# Patient Record
Sex: Female | Born: 1970
Health system: Southern US, Community
[De-identification: ages and names within clinical notes are randomized; demographics above are authoritative.]

## PROBLEM LIST (undated history)

## (undated) DIAGNOSIS — O24419 Gestational diabetes mellitus in pregnancy, unspecified control: Secondary | ICD-10-CM

## (undated) DIAGNOSIS — E282 Polycystic ovarian syndrome: Secondary | ICD-10-CM

## (undated) DIAGNOSIS — R7303 Prediabetes: Secondary | ICD-10-CM

## (undated) DIAGNOSIS — D689 Coagulation defect, unspecified: Secondary | ICD-10-CM

## (undated) DIAGNOSIS — N979 Female infertility, unspecified: Secondary | ICD-10-CM

## (undated) DIAGNOSIS — D6851 Activated protein C resistance: Secondary | ICD-10-CM

## (undated) DIAGNOSIS — Z1589 Genetic susceptibility to other disease: Secondary | ICD-10-CM

## (undated) HISTORY — DX: Female infertility, unspecified: N97.9

## (undated) HISTORY — DX: Polycystic ovarian syndrome: E28.2

## (undated) HISTORY — DX: Activated protein C resistance: D68.51

## (undated) HISTORY — PX: TONSILLECTOMY: SUR1361

## (undated) HISTORY — DX: Prediabetes: R73.03

## (undated) HISTORY — DX: Coagulation defect, unspecified: D68.9

## (undated) HISTORY — PX: OTHER SURGICAL HISTORY: SHX169

## (undated) HISTORY — DX: Gestational diabetes mellitus in pregnancy, unspecified control: O24.419

## (undated) HISTORY — DX: Genetic susceptibility to other disease: Z15.89

---

## 1996-12-10 HISTORY — PX: COLPOSCOPY: SHX161

## 2004-12-10 DIAGNOSIS — O24419 Gestational diabetes mellitus in pregnancy, unspecified control: Secondary | ICD-10-CM

## 2007-12-11 DIAGNOSIS — O24419 Gestational diabetes mellitus in pregnancy, unspecified control: Secondary | ICD-10-CM

## 2016-01-19 DIAGNOSIS — L910 Hypertrophic scar: Secondary | ICD-10-CM | POA: Diagnosis not present

## 2016-01-19 DIAGNOSIS — D2272 Melanocytic nevi of left lower limb, including hip: Secondary | ICD-10-CM | POA: Diagnosis not present

## 2016-01-19 DIAGNOSIS — D2261 Melanocytic nevi of right upper limb, including shoulder: Secondary | ICD-10-CM | POA: Diagnosis not present

## 2016-01-19 DIAGNOSIS — D2262 Melanocytic nevi of left upper limb, including shoulder: Secondary | ICD-10-CM | POA: Diagnosis not present

## 2016-01-19 DIAGNOSIS — D2271 Melanocytic nevi of right lower limb, including hip: Secondary | ICD-10-CM | POA: Diagnosis not present

## 2016-01-19 DIAGNOSIS — D225 Melanocytic nevi of trunk: Secondary | ICD-10-CM | POA: Diagnosis not present

## 2016-01-19 DIAGNOSIS — D1801 Hemangioma of skin and subcutaneous tissue: Secondary | ICD-10-CM | POA: Diagnosis not present

## 2016-01-31 DIAGNOSIS — Z1389 Encounter for screening for other disorder: Secondary | ICD-10-CM | POA: Diagnosis not present

## 2016-01-31 DIAGNOSIS — Z6824 Body mass index (BMI) 24.0-24.9, adult: Secondary | ICD-10-CM | POA: Diagnosis not present

## 2016-01-31 DIAGNOSIS — K219 Gastro-esophageal reflux disease without esophagitis: Secondary | ICD-10-CM | POA: Diagnosis not present

## 2016-01-31 DIAGNOSIS — R7302 Impaired glucose tolerance (oral): Secondary | ICD-10-CM | POA: Diagnosis not present

## 2016-01-31 DIAGNOSIS — E048 Other specified nontoxic goiter: Secondary | ICD-10-CM | POA: Diagnosis not present

## 2016-01-31 DIAGNOSIS — J45909 Unspecified asthma, uncomplicated: Secondary | ICD-10-CM | POA: Diagnosis not present

## 2016-01-31 DIAGNOSIS — D682 Hereditary deficiency of other clotting factors: Secondary | ICD-10-CM | POA: Diagnosis not present

## 2016-05-10 MED FILL — PREVIDENT 5000 SENSITIVE PA: 1.1-5 | 30 days supply | Qty: 100 | Fill #0

## 2016-09-14 DIAGNOSIS — Z23 Encounter for immunization: Secondary | ICD-10-CM | POA: Diagnosis not present

## 2016-10-09 ENCOUNTER — Ambulatory Visit (INDEPENDENT_AMBULATORY_CARE_PROVIDER_SITE_OTHER): Payer: 59 | Admitting: Obstetrics & Gynecology

## 2016-10-09 ENCOUNTER — Encounter: Payer: Self-pay | Admitting: Obstetrics & Gynecology

## 2016-10-09 VITALS — BP 96/64 | HR 82 | Resp 14 | Ht 64.0 in | Wt 139.4 lb

## 2016-10-09 DIAGNOSIS — E282 Polycystic ovarian syndrome: Secondary | ICD-10-CM | POA: Diagnosis not present

## 2016-10-09 DIAGNOSIS — Z124 Encounter for screening for malignant neoplasm of cervix: Secondary | ICD-10-CM

## 2016-10-09 DIAGNOSIS — D6851 Activated protein C resistance: Secondary | ICD-10-CM | POA: Diagnosis not present

## 2016-10-09 DIAGNOSIS — E7212 Methylenetetrahydrofolate reductase deficiency: Secondary | ICD-10-CM | POA: Insufficient documentation

## 2016-10-09 DIAGNOSIS — Z1589 Genetic susceptibility to other disease: Secondary | ICD-10-CM

## 2016-10-09 DIAGNOSIS — Z01419 Encounter for gynecological examination (general) (routine) without abnormal findings: Secondary | ICD-10-CM

## 2016-10-09 DIAGNOSIS — Z1151 Encounter for screening for human papillomavirus (HPV): Secondary | ICD-10-CM | POA: Diagnosis not present

## 2016-10-09 NOTE — Progress Notes (Signed)
45 y.o. [id number] Married Caucasian F here for annual exam.  She is a new patient.  Referred from Dr. Forde Dandy.    Husband is a pediatric intensivist at [employer].  Pt is internal medicine physician by training.  Not working right now.    H/O PCOS.  Was on clomid.  Did IVF.  Had ovarian hyperstimulation with her first cycle.  Had work-up for recurrent miscarriages.  Is a carrier for MTHFR and Factor 5 Leiden heterozygous.  Was gestational diabetic with first pregnancy and then insulin requiring with her second pregnancy.    Cycles are very irregular.  Bleeding lasts typically five days.  First day is heaviest.  Longest amount of time between cycles is six weeks but then can have a cycle earlier.    PCP:  Dr. Forde Dandy.  Having yearly HbA1C on pt.    Patient's last menstrual period was 08/29/2016.          Sexually active: Yes.    The current method of family planning is vasectomy.    Exercising: Yes.    walking, elliptical  Smoker:  no  Health Maintenance: Pap:  2014 in [place] History of abnormal Pap:  Yes.  Remote hx.  MMG: never Colonoscopy:  never BMD:   never TDaP:  09/19/08  Pneumonia vaccine(s):  never Zostavax:   never Hep C testing: not indicated Screening Labs: PCP, Hb today: PCP, Urine today: declined   reports that she has never smoked. She has never used smokeless tobacco. She reports that she drinks alcohol. She reports that she does not use drugs.  Past Medical History:  Diagnosis Date  . Clotting disorder ([place])    carrier for MHTFR & Factor 5 Liden   . Gestational diabetes    2006 & 2009   . Infertility, female   . PCOS (polycystic ovarian syndrome)     Past Surgical History:  Procedure Laterality Date  . COLPOSCOPY  1998  . IVF    . maxillofacial surgery      teenager due to TMJ  . TONSILLECTOMY      Current Outpatient Prescriptions  Medication Sig Dispense Refill  . cetirizine (ZYRTEC) 10 MG tablet Take 10 mg by mouth as needed for allergies.    Marland Kitchen ibuprofen  (ADVIL,MOTRIN) 200 MG tablet Take 600 mg by mouth as needed.     No current facility-administered medications for this visit.     Family History  Problem Relation Age of Onset  . Osteoporosis Mother   . Hyperlipidemia Mother   . Parkinson's disease Mother   . Diabetes Father   . Hyperlipidemia Father   . GER disease Father   . Cancer Father     CLL (Chronic Lymphocytic Leukemia)  . Osteoporosis Maternal Grandmother     ROS:  Pertinent items are noted in HPI.  Otherwise, a comprehensive ROS was negative.  Exam:   BP 96/64 (BP Location: Right Arm, Patient Position: Sitting, Cuff Size: Normal)   Pulse 82   Resp 14   Ht 5' 4"  (1.626 m)   Wt 139 lb 6.4 oz (63.2 kg)   LMP 08/29/2016   BMI 23.93 kg/m    Height: 5' 4"  (162.6 cm)  Ht Readings from Last 3 Encounters:  10/09/16 5' 4"  (1.626 m)    General appearance: alert, cooperative and appears stated age Head: Normocephalic, without obvious abnormality, atraumatic Neck: no adenopathy, supple, symmetrical, trachea midline and thyroid normal to inspection and palpation Lungs: clear to auscultation bilaterally Breasts: normal appearance, no  masses or tenderness Heart: regular rate and rhythm Abdomen: soft, non-tender; bowel sounds normal; no masses,  no organomegaly Extremities: extremities normal, atraumatic, no cyanosis or edema Skin: Skin color, texture, turgor normal. No rashes or lesions Lymph nodes: Cervical, supraclavicular, and axillary nodes normal. No abnormal inguinal nodes palpated Neurologic: Grossly normal   Pelvic: External genitalia:  no lesions              Urethra:  normal appearing urethra with no masses, tenderness or lesions              Bartholins and Skenes: normal                 Vagina: normal appearing vagina with normal color and discharge, no lesions              Cervix: no lesions              Pap taken: Yes.   Bimanual Exam:  Uterus:  normal size, contour, position, consistency, mobility,  non-tender              Adnexa: normal adnexa and no mass, fullness, tenderness               Rectovaginal: Confirms               Anus:  normal sphincter tone, no lesions  Chaperone was present for exam.  A:  Well Woman with normal exam H/O PCOS H/O infertility MTHRF and Factor V Leiden heterozygous hx Irregular menstrual cycles  P:   Mammogram guidelines reviewed.  Information given.  Pt will scheduled. pap smear and HR HPV obtained today Lab work and vaccine with Dr. Carolanne Grumbling IUD information provided.  Would not recommend any estrogen containing contraceptive methods Return annually or prn

## 2016-10-10 DIAGNOSIS — Z1231 Encounter for screening mammogram for malignant neoplasm of breast: Secondary | ICD-10-CM | POA: Diagnosis not present

## 2016-10-11 LAB — IPS PAP TEST WITH HPV

## 2016-10-19 ENCOUNTER — Telehealth: Payer: Self-pay | Admitting: Obstetrics & Gynecology

## 2016-10-19 NOTE — Telephone Encounter (Signed)
Patient would like to get her PAP results

## 2016-10-19 NOTE — Telephone Encounter (Signed)
Spoke with patient. Advised of results as seen below patient verbalizes understanding.  Notes Recorded by Matthew Folks, RN on 10/15/2016 at 10:41 AM EST Annual exam scheduled for 10/14/17. ------  Notes Recorded by Megan Salon, MD on 10/12/2016 at 6:07 PM EDT 02 recall.  Routing to provider for final review. Patient agreeable to disposition. Will close encounter.

## 2016-10-23 ENCOUNTER — Encounter: Payer: Self-pay | Admitting: Obstetrics & Gynecology

## 2016-11-23 MED FILL — PREVIDENT 5000 SENSITIVE PA: 1.1-5 | 30 days supply | Qty: 100 | Fill #1

## 2017-01-30 DIAGNOSIS — Z6822 Body mass index (BMI) 22.0-22.9, adult: Secondary | ICD-10-CM | POA: Diagnosis not present

## 2017-01-30 DIAGNOSIS — J45909 Unspecified asthma, uncomplicated: Secondary | ICD-10-CM | POA: Diagnosis not present

## 2017-01-30 DIAGNOSIS — E048 Other specified nontoxic goiter: Secondary | ICD-10-CM | POA: Diagnosis not present

## 2017-01-30 DIAGNOSIS — K219 Gastro-esophageal reflux disease without esophagitis: Secondary | ICD-10-CM | POA: Diagnosis not present

## 2017-01-30 DIAGNOSIS — R7302 Impaired glucose tolerance (oral): Secondary | ICD-10-CM | POA: Diagnosis not present

## 2017-02-01 DIAGNOSIS — D225 Melanocytic nevi of trunk: Secondary | ICD-10-CM | POA: Diagnosis not present

## 2017-02-01 DIAGNOSIS — D2271 Melanocytic nevi of right lower limb, including hip: Secondary | ICD-10-CM | POA: Diagnosis not present

## 2017-02-01 DIAGNOSIS — D2261 Melanocytic nevi of right upper limb, including shoulder: Secondary | ICD-10-CM | POA: Diagnosis not present

## 2017-02-01 DIAGNOSIS — I788 Other diseases of capillaries: Secondary | ICD-10-CM | POA: Diagnosis not present

## 2017-02-01 DIAGNOSIS — D1801 Hemangioma of skin and subcutaneous tissue: Secondary | ICD-10-CM | POA: Diagnosis not present

## 2017-02-01 DIAGNOSIS — D2262 Melanocytic nevi of left upper limb, including shoulder: Secondary | ICD-10-CM | POA: Diagnosis not present

## 2017-02-01 DIAGNOSIS — D2272 Melanocytic nevi of left lower limb, including hip: Secondary | ICD-10-CM | POA: Diagnosis not present

## 2017-03-08 DIAGNOSIS — Z3183 Encounter for assisted reproductive fertility procedure cycle: Secondary | ICD-10-CM | POA: Diagnosis not present

## 2017-03-08 DIAGNOSIS — H18603 Keratoconus, unspecified, bilateral: Secondary | ICD-10-CM | POA: Diagnosis not present

## 2017-03-08 DIAGNOSIS — H52213 Irregular astigmatism, bilateral: Secondary | ICD-10-CM | POA: Diagnosis not present

## 2017-03-08 DIAGNOSIS — H18713 Corneal ectasia, bilateral: Secondary | ICD-10-CM | POA: Diagnosis not present

## 2017-04-03 DIAGNOSIS — H18713 Corneal ectasia, bilateral: Secondary | ICD-10-CM | POA: Diagnosis not present

## 2017-05-01 DIAGNOSIS — D1801 Hemangioma of skin and subcutaneous tissue: Secondary | ICD-10-CM | POA: Diagnosis not present

## 2017-09-11 MED FILL — PREVIDENT 5000 SENSITIVE PA: 1.1-5 | 30 days supply | Qty: 100 | Fill #0

## 2017-10-14 ENCOUNTER — Ambulatory Visit (INDEPENDENT_AMBULATORY_CARE_PROVIDER_SITE_OTHER): Payer: 59 | Admitting: Obstetrics & Gynecology

## 2017-10-14 ENCOUNTER — Encounter: Payer: Self-pay | Admitting: Obstetrics & Gynecology

## 2017-10-14 VITALS — BP 112/64 | HR 80 | Resp 16 | Ht 63.25 in | Wt 141.8 lb

## 2017-10-14 DIAGNOSIS — Z01419 Encounter for gynecological examination (general) (routine) without abnormal findings: Secondary | ICD-10-CM | POA: Diagnosis not present

## 2017-10-14 NOTE — Progress Notes (Signed)
46 y.o. [id number] MarriedCaucasianF here for annual exam.  Doing well.  Cycles are a little more regular this year.  Flow seems a little lighter this year.  Has occasional months where she has two or three days of low back pain with some sciatic pain.  Motrin and heat help.  Is gone after two or three days.  PCP:  Dr. Forde Dandy  Patient's last menstrual period was 09/17/2017.          Sexually active: Yes.    The current method of family planning is vasectomy.    Exercising: No.  The patient does not participate in regular exercise at present. Smoker:  no  Health Maintenance: Pap:  10/09/16 Pap and HR HPV negative History of abnormal Pap:  Yes, years ago MMG:  10/10/16 BIRADS 1 negative/density b Colonoscopy:  never BMD:   never TDaP:  09/19/08 Pneumonia vaccine(s):  never Zostavax:   never Hep C testing: not indicated Screening Labs: PCP   reports that  has never smoked. she has never used smokeless tobacco. She reports that she drinks alcohol. She reports that she does not use drugs.  Past Medical History:  Diagnosis Date  . Clotting disorder ([place])    carrier for MTHFR & Factor 5 Liden   . Gestational diabetes    2006 & 2009   . Infertility, female   . PCOS (polycystic ovarian syndrome)     Past Surgical History:  Procedure Laterality Date  . COLPOSCOPY  1998  . IVF    . maxillofacial surgery      teenager due to TMJ  . TONSILLECTOMY      Current Outpatient Medications  Medication Sig Dispense Refill  . cetirizine (ZYRTEC) 10 MG tablet Take 10 mg by mouth as needed for allergies.    Marland Kitchen ibuprofen (ADVIL,MOTRIN) 200 MG tablet Take 600 mg by mouth as needed.     No current facility-administered medications for this visit.     Family History  Problem Relation Age of Onset  . Osteoporosis Mother   . Hyperlipidemia Mother   . Parkinson's disease Mother   . Diabetes Father   . Hyperlipidemia Father   . GER disease Father   . Cancer Father        CLL (Chronic Lymphocytic  Leukemia)  . Osteoporosis Maternal Grandmother     ROS:  Pertinent items are noted in HPI.  Otherwise, a comprehensive ROS was negative.  Exam:   BP 112/64 (BP Location: Right Arm, Patient Position: Sitting, Cuff Size: Normal)   Pulse 80   Resp 16   Ht 5' 3.25" (1.607 m)   Wt 141 lb 12.8 oz (64.3 kg)   LMP 09/17/2017   BMI 24.92 kg/m   Height: 5' 3.25" (160.7 cm)  Ht Readings from Last 3 Encounters:  10/14/17 5' 3.25" (1.607 m)  10/09/16 5' 4"  (1.626 m)    General appearance: alert, cooperative and appears stated age Head: Normocephalic, without obvious abnormality, atraumatic Neck: no adenopathy, supple, symmetrical, trachea midline and thyroid normal to inspection and palpation Lungs: clear to auscultation bilaterally Breasts: normal appearance, no masses or tenderness Heart: regular rate and rhythm Abdomen: soft, non-tender; bowel sounds normal; no masses,  no organomegaly Extremities: extremities normal, atraumatic, no cyanosis or edema Skin: Skin color, texture, turgor normal. No rashes or lesions Lymph nodes: Cervical, supraclavicular, and axillary nodes normal. No abnormal inguinal nodes palpated Neurologic: Grossly normal   Pelvic: External genitalia:  no lesions  Urethra:  normal appearing urethra with no masses, tenderness or lesions              Bartholins and Skenes: normal                 Vagina: normal appearing vagina with normal color and discharge, no lesions              Cervix: no lesions              Pap taken: No. Bimanual Exam:  Uterus:  normal size, contour, position, consistency, mobility, non-tender              Adnexa: normal adnexa and no mass, fullness, tenderness               Rectovaginal: Confirms               Anus:  normal sphincter tone, no lesions  Chaperone was present for exam.  A:  Well Woman with normal exam H/O PCOS H/O infertility MTHFR and Factor V Leiden heterozygous hx Irregular cycles that have improved H/O  gestational diabetes Low back pain, intermittent, that seems mid cycle to her  P:   Mammogram guidelines reviewed.  Will repeat next year pap smear with neg HR HPV obtained 2017. ACS colonoscopy guidelines reviewed Consider PUS with onset of pt.  Pt knows to call to be scheduled when having pain if possible Will update Tdap next year Lab work and vaccines UTD Return annually or prn

## 2018-01-29 MED FILL — PREVIDENT 5000 SENSITIVE PA: 1.1-5 | 30 days supply | Qty: 100 | Fill #1

## 2018-03-20 DIAGNOSIS — D2262 Melanocytic nevi of left upper limb, including shoulder: Secondary | ICD-10-CM | POA: Diagnosis not present

## 2018-03-20 DIAGNOSIS — Q828 Other specified congenital malformations of skin: Secondary | ICD-10-CM | POA: Diagnosis not present

## 2018-03-20 DIAGNOSIS — D2271 Melanocytic nevi of right lower limb, including hip: Secondary | ICD-10-CM | POA: Diagnosis not present

## 2018-03-20 DIAGNOSIS — D2272 Melanocytic nevi of left lower limb, including hip: Secondary | ICD-10-CM | POA: Diagnosis not present

## 2018-03-20 DIAGNOSIS — D225 Melanocytic nevi of trunk: Secondary | ICD-10-CM | POA: Diagnosis not present

## 2018-03-20 DIAGNOSIS — L814 Other melanin hyperpigmentation: Secondary | ICD-10-CM | POA: Diagnosis not present

## 2018-03-20 DIAGNOSIS — L218 Other seborrheic dermatitis: Secondary | ICD-10-CM | POA: Diagnosis not present

## 2018-03-20 DIAGNOSIS — L821 Other seborrheic keratosis: Secondary | ICD-10-CM | POA: Diagnosis not present

## 2018-03-20 DIAGNOSIS — D2261 Melanocytic nevi of right upper limb, including shoulder: Secondary | ICD-10-CM | POA: Diagnosis not present

## 2018-06-06 DIAGNOSIS — J45909 Unspecified asthma, uncomplicated: Secondary | ICD-10-CM | POA: Diagnosis not present

## 2018-06-06 DIAGNOSIS — K219 Gastro-esophageal reflux disease without esophagitis: Secondary | ICD-10-CM | POA: Diagnosis not present

## 2018-06-06 DIAGNOSIS — R7302 Impaired glucose tolerance (oral): Secondary | ICD-10-CM | POA: Diagnosis not present

## 2018-06-06 DIAGNOSIS — E048 Other specified nontoxic goiter: Secondary | ICD-10-CM | POA: Diagnosis not present

## 2018-06-06 DIAGNOSIS — Z1389 Encounter for screening for other disorder: Secondary | ICD-10-CM | POA: Diagnosis not present

## 2018-06-06 DIAGNOSIS — Z6824 Body mass index (BMI) 24.0-24.9, adult: Secondary | ICD-10-CM | POA: Diagnosis not present

## 2018-07-30 MED FILL — PREVIDENT 5000 SENSITIVE PA: 1.1-5 | 30 days supply | Qty: 100 | Fill #2

## 2018-10-08 DIAGNOSIS — Z23 Encounter for immunization: Secondary | ICD-10-CM | POA: Diagnosis not present

## 2018-12-12 DIAGNOSIS — S93524A Sprain of metatarsophalangeal joint of right lesser toe(s), initial encounter: Secondary | ICD-10-CM | POA: Diagnosis not present

## 2018-12-30 ENCOUNTER — Encounter: Payer: Self-pay | Admitting: Obstetrics & Gynecology

## 2018-12-30 ENCOUNTER — Ambulatory Visit (INDEPENDENT_AMBULATORY_CARE_PROVIDER_SITE_OTHER): Payer: 59 | Admitting: Obstetrics & Gynecology

## 2018-12-30 VITALS — BP 120/86 | HR 100 | Resp 16 | Ht 63.5 in | Wt 136.0 lb

## 2018-12-30 DIAGNOSIS — Z01419 Encounter for gynecological examination (general) (routine) without abnormal findings: Secondary | ICD-10-CM

## 2018-12-30 DIAGNOSIS — Z23 Encounter for immunization: Secondary | ICD-10-CM

## 2018-12-30 DIAGNOSIS — Z1211 Encounter for screening for malignant neoplasm of colon: Secondary | ICD-10-CM | POA: Diagnosis not present

## 2018-12-30 NOTE — Progress Notes (Signed)
48 y.o. [id number] Married White or Caucasian female here for annual exam.  Doing well.  Denies vaginal bleeding.  Father was hospitalized over the holidays and this was stressful for her.  He has CLL and had pneumonia over the holiday.  He has issues with aspiration.    Cycles are "fairly" regular.  Flow has gotten much shorter with only one that is heavy.   PCP:  Dr. Forde Dandy.  Has appt in June.       Patient's last menstrual period was 12/11/2018 (exact date).          Sexually active: Yes.    The current method of family planning is vasectomy.    Exercising: No.   Smoker:  no  Health Maintenance: Pap:  10/09/16 Neg. HR HPV:neg  History of abnormal Pap:  Yes, remote hx  MMG:  10/10/16 BIRADS1:Neg  Colonoscopy:  Never BMD:   Never TDaP:  09/19/2008 Screening Labs: PCP   reports that she has never smoked. She has never used smokeless tobacco. She reports previous alcohol use. She reports that she does not use drugs.  Past Medical History:  Diagnosis Date  . Clotting disorder ([place])    carrier for MTHFR & Factor 5 Liden   . Gestational diabetes    2006 & 2009   . Infertility, female   . PCOS (polycystic ovarian syndrome)     Past Surgical History:  Procedure Laterality Date  . COLPOSCOPY  1998  . IVF    . maxillofacial surgery      teenager due to TMJ  . TONSILLECTOMY      Current Outpatient Medications  Medication Sig Dispense Refill  . cetirizine (ZYRTEC) 10 MG tablet Take 10 mg by mouth as needed for allergies.    Marland Kitchen ibuprofen (ADVIL,MOTRIN) 200 MG tablet Take 600 mg by mouth as needed.     No current facility-administered medications for this visit.     Family History  Problem Relation Age of Onset  . Osteoporosis Mother   . Hyperlipidemia Mother   . Parkinson's disease Mother   . Diabetes Father   . Hyperlipidemia Father   . GER disease Father   . Cancer Father        CLL (Chronic Lymphocytic Leukemia)  . Osteoporosis Maternal Grandmother     Review of  Systems  Genitourinary:       Menstrual cycle changes   Musculoskeletal: Positive for myalgias.  All other systems reviewed and are negative.   Exam:   BP 120/86 (BP Location: Right Arm, Patient Position: Sitting, Cuff Size: Normal)   Pulse 100   Resp 16   Ht 5' 3.5" (1.613 m)   Wt 136 lb (61.7 kg)   LMP 12/11/2018 (Exact Date)   BMI 23.71 kg/m      Height: 5' 3.5" (161.3 cm)  Ht Readings from Last 3 Encounters:  12/30/18 5' 3.5" (1.613 m)  10/14/17 5' 3.25" (1.607 m)  10/09/16 5' 4"  (1.626 m)    General appearance: alert, cooperative and appears stated age Head: Normocephalic, without obvious abnormality, atraumatic Neck: no adenopathy, supple, symmetrical, trachea midline and thyroid normal to inspection and palpation Lungs: clear to auscultation bilaterally Breasts: normal appearance, no masses or tenderness Heart: regular rate and rhythm Abdomen: soft, non-tender; bowel sounds normal; no masses,  no organomegaly Extremities: extremities normal, atraumatic, no cyanosis or edema Skin: Skin color, texture, turgor normal. No rashes or lesions Lymph nodes: Cervical, supraclavicular, and axillary nodes normal. No abnormal inguinal nodes palpated Neurologic:  Grossly normal   Pelvic: External genitalia:  no lesions              Urethra:  normal appearing urethra with no masses, tenderness or lesions              Bartholins and Skenes: normal                 Vagina: normal appearing vagina with normal color and discharge, no lesions              Cervix: no lesions              Pap taken: No. Bimanual Exam:  Uterus:  normal size, contour, position, consistency, mobility, non-tender              Adnexa: normal adnexa and no mass, fullness, tenderness               Rectovaginal: Confirms               Anus:  normal sphincter tone, no lesions  Chaperone was present for exam.  A:  Well Woman with normal exam H/O PCOS Infertility hx H/O +MRHFT and Factor V Leiden heterozygous  testing H/O gestational diabetes  P:   Mammogram guidelines reviewed.  Doing yearly pap smear with neg HR HPV 10/17 Tdap updated today New ACS colon cancer guidelines reviewed.  IFOB given.  Lab work done with Dr. Forde Dandy this summer. return annually or prn

## 2018-12-30 NOTE — Addendum Note (Signed)
Addended by: Polly Cobia on: 12/30/2018 03:45 PM   Modules accepted: Orders

## 2018-12-31 ENCOUNTER — Telehealth: Payer: Self-pay | Admitting: Obstetrics & Gynecology

## 2018-12-31 DIAGNOSIS — Z1211 Encounter for screening for malignant neoplasm of colon: Secondary | ICD-10-CM

## 2018-12-31 NOTE — Telephone Encounter (Signed)
Order to Dr. Sabra Heck.  Responded to patient via mychart.  Encounter closed.

## 2018-12-31 NOTE — Telephone Encounter (Signed)
Patient sent the following correspondence through [place]. Routing to triage to assist patient with request.  I have checked with my insurance and Cologuard is covered 100% regardless of age. I would like you to order it for me.     Thank you,    Tina Jensen

## 2019-01-07 MED FILL — PREVIDENT 5000 BOOSTER PLUS: 1.1 | 30 days supply | Qty: 100 | Fill #0

## 2019-01-29 MED FILL — PREVIDENT 5000 SENSITIVE PA: 1.1-5 | 30 days supply | Qty: 100 | Fill #0

## 2019-03-02 ENCOUNTER — Encounter: Payer: Self-pay | Admitting: Obstetrics & Gynecology

## 2019-03-02 DIAGNOSIS — Z1212 Encounter for screening for malignant neoplasm of rectum: Secondary | ICD-10-CM | POA: Diagnosis not present

## 2019-03-02 DIAGNOSIS — Z1211 Encounter for screening for malignant neoplasm of colon: Secondary | ICD-10-CM | POA: Diagnosis not present

## 2019-03-05 LAB — COLOGUARD: Cologuard: NEGATIVE

## 2019-03-09 ENCOUNTER — Telehealth: Payer: Self-pay | Admitting: Obstetrics and Gynecology

## 2019-03-09 NOTE — Telephone Encounter (Signed)
Patient notified of results. Patient verbalizes understanding and is agreeable.   Encounter closed.

## 2019-03-09 NOTE — Telephone Encounter (Signed)
This is Dr. Quincy Simmonds covering for Dr. Sabra Heck who is out of the office.   Please let patient know of her negative Cologuard test result.

## 2019-05-14 DIAGNOSIS — D2262 Melanocytic nevi of left upper limb, including shoulder: Secondary | ICD-10-CM | POA: Diagnosis not present

## 2019-05-14 DIAGNOSIS — L821 Other seborrheic keratosis: Secondary | ICD-10-CM | POA: Diagnosis not present

## 2019-05-14 DIAGNOSIS — D225 Melanocytic nevi of trunk: Secondary | ICD-10-CM | POA: Diagnosis not present

## 2019-05-14 DIAGNOSIS — D2261 Melanocytic nevi of right upper limb, including shoulder: Secondary | ICD-10-CM | POA: Diagnosis not present

## 2019-06-08 DIAGNOSIS — E785 Hyperlipidemia, unspecified: Secondary | ICD-10-CM | POA: Insufficient documentation

## 2019-06-08 DIAGNOSIS — K219 Gastro-esophageal reflux disease without esophagitis: Secondary | ICD-10-CM | POA: Diagnosis not present

## 2019-06-08 DIAGNOSIS — Z1331 Encounter for screening for depression: Secondary | ICD-10-CM | POA: Diagnosis not present

## 2019-06-08 DIAGNOSIS — R7302 Impaired glucose tolerance (oral): Secondary | ICD-10-CM | POA: Diagnosis not present

## 2019-06-08 DIAGNOSIS — J45909 Unspecified asthma, uncomplicated: Secondary | ICD-10-CM | POA: Diagnosis not present

## 2019-06-08 DIAGNOSIS — E049 Nontoxic goiter, unspecified: Secondary | ICD-10-CM | POA: Diagnosis not present

## 2019-07-01 DIAGNOSIS — H18623 Keratoconus, unstable, bilateral: Secondary | ICD-10-CM | POA: Diagnosis not present

## 2019-09-12 DIAGNOSIS — Z23 Encounter for immunization: Secondary | ICD-10-CM | POA: Diagnosis not present

## 2020-01-04 ENCOUNTER — Telehealth: Payer: Self-pay | Admitting: *Deleted

## 2020-01-04 ENCOUNTER — Other Ambulatory Visit: Payer: Self-pay

## 2020-01-04 ENCOUNTER — Ambulatory Visit (INDEPENDENT_AMBULATORY_CARE_PROVIDER_SITE_OTHER): Payer: BC Managed Care – PPO | Admitting: Obstetrics & Gynecology

## 2020-01-04 ENCOUNTER — Encounter: Payer: Self-pay | Admitting: Obstetrics & Gynecology

## 2020-01-04 VITALS — BP 110/60 | HR 88 | Temp 97.9°F | Resp 10 | Ht 63.25 in | Wt 148.0 lb

## 2020-01-04 DIAGNOSIS — Z01419 Encounter for gynecological examination (general) (routine) without abnormal findings: Secondary | ICD-10-CM

## 2020-01-04 DIAGNOSIS — E049 Nontoxic goiter, unspecified: Secondary | ICD-10-CM

## 2020-01-04 NOTE — Telephone Encounter (Signed)
-----   Message from Megan Salon, MD sent at 01/04/2020  2:58 PM EST ----- Regarding: thyroid ultrasound Sharee Pimple This pt needs a thyroid ultrasound scheduled.  Afternoons are best.  Thanks. Vinnie Level

## 2020-01-04 NOTE — Telephone Encounter (Signed)
Order placed for Thyroid US at [facility].   Spoke with Tye [place] at [facility]. Patient scheduled for thyroid US on 01/06/20 arriving at 3:40pm, appt at 4pm. [address] location.   Spoke with patient, advised of appt as seen above, patient is agreeable to date and time.   Placed in [place] hold.   Routing to provider for final review. Patient is agreeable to disposition. Will close encounter.   Cc: Lerry Liner, Magdalene Patricia

## 2020-01-04 NOTE — Progress Notes (Signed)
49 y.o. [id number] Married White or Caucasian female here for annual exam.  Doing well but father died in 28-Nov-2023.  He was in [place].  She was able to go home and be with him.    Husband took a new job with [employer] about a year ago.  Has been working from home.    Cycles are still regular.  Flow lasts about 3 days.  This continues to be lighter.    Patient's last menstrual period was 12/12/2019.          Sexually active: Yes.    The current method of family planning is vasectomy.    Exercising: No.  The patient does not participate in regular exercise at present. Smoker:  no  Health Maintenance: Pap:  10/09/16 Neg. HR HPV:neg  History of abnormal Pap:  Yes, remote hx (had colposcopies but never had treatment.  At least 20 years ago).   MMG: 10/10/16 BIRADS1:Neg.  Aware this is due.   Colonoscopy:  Cologuard in 2020 which was negative BMD:   n/a TDaP:  12/30/18 Pneumonia vaccine(s):  n/a Shingrix:   no Hep C testing: n/a Screening Labs: PCP   reports that she has never smoked. She has never used smokeless tobacco. She reports previous alcohol use. She reports that she does not use drugs.  Past Medical History:  Diagnosis Date  . Clotting disorder ([place])    carrier for MTHFR & Factor 5 Liden   . Gestational diabetes    2006 & 2009   . Infertility, female   . PCOS (polycystic ovarian syndrome)     Past Surgical History:  Procedure Laterality Date  . COLPOSCOPY  1998  . IVF    . maxillofacial surgery      teenager due to TMJ  . TONSILLECTOMY      Current Outpatient Medications  Medication Sig Dispense Refill  . cetirizine (ZYRTEC) 10 MG tablet Take 10 mg by mouth as needed for allergies.    Marland Kitchen ibuprofen (ADVIL,MOTRIN) 200 MG tablet Take 600 mg by mouth as needed.    . SF 5000 PLUS 1.1 % CREA dental cream      No current facility-administered medications for this visit.    Family History  Problem Relation Age of Onset  . Osteoporosis Mother   . Hyperlipidemia Mother   .  Parkinson's disease Mother   . Diabetes Father   . Hyperlipidemia Father   . GER disease Father   . Cancer Father        CLL (Chronic Lymphocytic Leukemia)  . Osteoporosis Maternal Grandmother     Review of Systems  All other systems reviewed and are negative.   Exam:   BP 110/60 (BP Location: Right Arm, Patient Position: Sitting, Cuff Size: Normal)   Pulse 88   Temp 97.9 F (36.6 C) (Temporal)   Resp 10   Ht 5' 3.25" (1.607 m)   Wt 148 lb (67.1 kg)   LMP 12/12/2019   BMI 26.01 kg/m      Height: 5' 3.25" (160.7 cm)  Ht Readings from Last 3 Encounters:  01/04/20 5' 3.25" (1.607 m)  12/30/18 5' 3.5" (1.613 m)  10/14/17 5' 3.25" (1.607 m)    General appearance: alert, cooperative and appears stated age Head: Normocephalic, without obvious abnormality, atraumatic Neck: no adenopathy, supple, symmetrical, trachea midline and thyroid enlarged R>L without discrete mass Lungs: clear to auscultation bilaterally Breasts: normal appearance, no masses or tenderness Heart: regular rate and rhythm Abdomen: soft, non-tender; bowel sounds normal;  no masses,  no organomegaly Extremities: extremities normal, atraumatic, no cyanosis or edema Skin: Skin color, texture, turgor normal. No rashes or lesions Lymph nodes: Cervical, supraclavicular, and axillary nodes normal. No abnormal inguinal nodes palpated Neurologic: Grossly normal   Pelvic: External genitalia:  no lesions              Urethra:  normal appearing urethra with no masses, tenderness or lesions              Bartholins and Skenes: normal                 Vagina: normal appearing vagina with normal color and discharge, no lesions              Cervix: no lesions              Pap taken: No. Bimanual Exam:  Uterus:  normal size, contour, position, consistency, mobility, non-tender              Adnexa: normal adnexa and no mass, fullness, tenderness               Rectovaginal: Confirms               Anus:  normal sphincter  tone, no lesions  Chaperone, Terence Lux, CMA, was present for exam.  A:  Well Woman with normal exam H/o PCOS H/o infertility H/o being +MTHFR and factor V Leiden heterozygous H/o gestational diabetes Enlarged thyroid without masses noted today  P:   Mammogram guidelines reviewed.  Pt is aware this is due.  Phone number to schedule given pap smear neg with neg HR HPV 2017.  2020 ASCCP guidelines reviewed.  Not obtained today. Thyroid ultrasound will be scheduled TSH obtained today Remaining lab work obtained by Dr. Forde Dandy Return annually or prn

## 2020-01-05 LAB — TSH: TSH: 0.585 u[IU]/mL (ref 0.450–4.500)

## 2020-01-06 ENCOUNTER — Ambulatory Visit
Admission: RE | Admit: 2020-01-06 | Discharge: 2020-01-06 | Disposition: A | Discharge status: Home / Self Care | Payer: BC Managed Care – PPO | Source: Ambulatory Visit | Attending: Obstetrics & Gynecology | Admitting: Obstetrics & Gynecology

## 2020-01-06 DIAGNOSIS — E041 Nontoxic single thyroid nodule: Secondary | ICD-10-CM | POA: Diagnosis not present

## 2020-01-06 DIAGNOSIS — E049 Nontoxic goiter, unspecified: Secondary | ICD-10-CM

## 2020-01-07 ENCOUNTER — Other Ambulatory Visit: Payer: Self-pay | Admitting: *Deleted

## 2020-01-07 DIAGNOSIS — E041 Nontoxic single thyroid nodule: Secondary | ICD-10-CM

## 2020-01-07 DIAGNOSIS — E049 Nontoxic goiter, unspecified: Secondary | ICD-10-CM

## 2020-05-11 ENCOUNTER — Other Ambulatory Visit: Payer: Self-pay | Admitting: Obstetrics & Gynecology

## 2020-05-11 DIAGNOSIS — Z1231 Encounter for screening mammogram for malignant neoplasm of breast: Secondary | ICD-10-CM

## 2020-05-17 ENCOUNTER — Other Ambulatory Visit: Payer: Self-pay

## 2020-05-17 ENCOUNTER — Ambulatory Visit
Admission: RE | Admit: 2020-05-17 | Discharge: 2020-05-17 | Disposition: A | Discharge status: Home / Self Care | Payer: BC Managed Care – PPO | Source: Ambulatory Visit

## 2020-05-17 DIAGNOSIS — Z1231 Encounter for screening mammogram for malignant neoplasm of breast: Secondary | ICD-10-CM

## 2020-06-06 DIAGNOSIS — R7302 Impaired glucose tolerance (oral): Secondary | ICD-10-CM | POA: Diagnosis not present

## 2020-06-06 DIAGNOSIS — E7849 Other hyperlipidemia: Secondary | ICD-10-CM | POA: Diagnosis not present

## 2020-06-06 DIAGNOSIS — E049 Nontoxic goiter, unspecified: Secondary | ICD-10-CM | POA: Diagnosis not present

## 2020-06-06 DIAGNOSIS — J309 Allergic rhinitis, unspecified: Secondary | ICD-10-CM | POA: Diagnosis not present

## 2020-07-20 DIAGNOSIS — D2271 Melanocytic nevi of right lower limb, including hip: Secondary | ICD-10-CM | POA: Diagnosis not present

## 2020-07-20 DIAGNOSIS — D2261 Melanocytic nevi of right upper limb, including shoulder: Secondary | ICD-10-CM | POA: Diagnosis not present

## 2020-07-20 DIAGNOSIS — D225 Melanocytic nevi of trunk: Secondary | ICD-10-CM | POA: Diagnosis not present

## 2020-07-20 DIAGNOSIS — D2272 Melanocytic nevi of left lower limb, including hip: Secondary | ICD-10-CM | POA: Diagnosis not present

## 2020-09-10 DIAGNOSIS — Z23 Encounter for immunization: Secondary | ICD-10-CM | POA: Diagnosis not present

## 2020-11-15 ENCOUNTER — Telehealth: Payer: Self-pay

## 2020-11-15 DIAGNOSIS — E282 Polycystic ovarian syndrome: Secondary | ICD-10-CM

## 2020-11-15 DIAGNOSIS — R102 Pelvic and perineal pain: Secondary | ICD-10-CM

## 2020-11-15 NOTE — Telephone Encounter (Signed)
Spoke with patient regarding benefits for scheduled Pelvic ultrasound. Patient acknowledges understanding of information presented.

## 2020-11-15 NOTE — Telephone Encounter (Signed)
AEX 12/2019 with SM H/o PCOS No contraception or HRT LMP 11/29  Spoke with pt. Pt states having the intermittent, constant pelvic pain, low back pain that is mostly on right side for 1 week. Pt states had this in the past and Dr Sabra Heck told her to call and have PUS when happening to see if help with exam. Pt denies fever, chills, NVD. Only took Motrin or Tylenol PRN when had cycle. Describes pain as dull, was intermittent, but has been constant now for 1 week.   Per reviewed notes at [facility] 10/2017 by Dr Sabra Heck:  Consider PUS with onset of pt.  Pt knows to call to be scheduled when having pain if possible.  Pt advised ok to schedule PUS. Pt aware Dr Sabra Heck not in office. Ok to see another provider. Pt scheduled with first provider on PUS schedule this week since having current pain on 12/9 at 1100 am with Dr Talbert Nan.  Routing to Dr Talbert Nan for review Cc: Hayley for precert  Orders placed   Ok to proceed as scheduled?

## 2020-11-15 NOTE — Telephone Encounter (Signed)
Patient is having right abdominal pain.

## 2020-11-17 ENCOUNTER — Ambulatory Visit (INDEPENDENT_AMBULATORY_CARE_PROVIDER_SITE_OTHER): Payer: BC Managed Care – PPO

## 2020-11-17 ENCOUNTER — Other Ambulatory Visit: Payer: Self-pay

## 2020-11-17 ENCOUNTER — Ambulatory Visit (INDEPENDENT_AMBULATORY_CARE_PROVIDER_SITE_OTHER): Payer: BC Managed Care – PPO | Admitting: Obstetrics and Gynecology

## 2020-11-17 ENCOUNTER — Encounter: Payer: Self-pay | Admitting: Obstetrics and Gynecology

## 2020-11-17 VITALS — BP 110/64 | HR 97 | Ht 64.0 in | Wt 150.0 lb

## 2020-11-17 DIAGNOSIS — N949 Unspecified condition associated with female genital organs and menstrual cycle: Secondary | ICD-10-CM

## 2020-11-17 DIAGNOSIS — R102 Pelvic and perineal pain: Secondary | ICD-10-CM

## 2020-11-17 DIAGNOSIS — E282 Polycystic ovarian syndrome: Secondary | ICD-10-CM | POA: Diagnosis not present

## 2020-11-17 DIAGNOSIS — R9389 Abnormal findings on diagnostic imaging of other specified body structures: Secondary | ICD-10-CM

## 2020-11-17 MED ORDER — DOXYCYCLINE HYCLATE 100 MG PO CAPS: 100.0000 mg | ORAL_CAPSULE | Freq: Two times a day (BID) | ORAL | 0 refills | Status: DC

## 2020-11-17 NOTE — Progress Notes (Signed)
GYNECOLOGY  VISIT   HPI: 49 y.o.   Married White or Caucasian Not Hispanic or Latino  female   [phone] with Patient's last menstrual period was 11/07/2020.   here for consult for pelvic pain.  She reports intermittent issues with a dull, aching pelvic pain. Feels full in her pelvis. She has noticed more in the middle of the cycle, wondered if it was ovulation. This pain has been intermittent for at least 3 years.  This episode of pain started just prior to her cycle and has persisted. The pain is worse at night when she isn't active, hard to get comfortable. Can radiate to her buttock. No bowel changes, BM qd. No urinary c/o.   She reports monthly cycles x 3-4 days. Heavy x 1 day where she saturates a pad in 6 hours. No intermenstrual cramps. Bad cramps for one day and a little cramping prior to her cycle.     GYNECOLOGIC HISTORY: Patient's last menstrual period was 11/07/2020.  Contraception: vasectomy  Menopausal hormone therapy: none         OB History    Gravida  6   Para      Term      Preterm      AB  4   Living  2     SAB  4   IAB      Ectopic      Multiple      Live Births                 Patient Active Problem List   Diagnosis Date Noted  . PCOS (polycystic ovarian syndrome) 10/09/2016  . Factor V Leiden carrier ([place]) 10/09/2016  . MTHFR mutation 10/09/2016    Past Medical History:  Diagnosis Date  . Clotting disorder ([place])    carrier for MTHFR & Factor 5 Liden   . Gestational diabetes    2006 & 2009   . Infertility, female   . PCOS (polycystic ovarian syndrome)     Past Surgical History:  Procedure Laterality Date  . COLPOSCOPY  1998  . IVF    . maxillofacial surgery      teenager due to TMJ  . TONSILLECTOMY      Current Outpatient Medications  Medication Sig Dispense Refill  . cetirizine (ZYRTEC) 10 MG tablet Take 10 mg by mouth as needed for allergies.    Marland Kitchen ibuprofen (ADVIL,MOTRIN) 200 MG tablet Take 600 mg by mouth as needed.     . rosuvastatin (CRESTOR) 5 MG tablet Take 5 mg by mouth daily.    . SF 5000 PLUS 1.1 % CREA dental cream      No current facility-administered medications for this visit.     ALLERGIES: Patient has no known allergies.  Family History  Problem Relation Age of Onset  . Osteoporosis Mother   . Hyperlipidemia Mother   . Parkinson's disease Mother   . Diabetes Father   . Hyperlipidemia Father   . GER disease Father   . Cancer Father        CLL (Chronic Lymphocytic Leukemia)  . Osteoporosis Maternal Grandmother     Social History   Socioeconomic History  . Marital status: Married    Spouse name: Not on file  . Number of children: Not on file  . Years of education: Not on file  . Highest education level: Not on file  Occupational History  . Not on file  Tobacco Use  . Smoking status: Never Smoker  .  Smokeless tobacco: Never Used  Vaping Use  . Vaping Use: Never used  Substance and Sexual Activity  . Alcohol use: Not Currently  . Drug use: No  . Sexual activity: Yes    Comment: husband vasectomy  Other Topics Concern  . Not on file  Social History Narrative  . Not on file   Social Determinants of Health   Financial Resource Strain: Not on file  Food Insecurity: Not on file  Transportation Needs: Not on file  Physical Activity: Not on file  Stress: Not on file  Social Connections: Not on file  Intimate Partner Violence: Not on file    Review of Systems  All other systems reviewed and are negative.   PHYSICAL EXAMINATION:    BP 110/64   Pulse 97   Ht 5' 4"  (1.626 m)   Wt 150 lb (68 kg)   LMP 11/07/2020   SpO2 98%   BMI 25.75 kg/m     General appearance: alert, cooperative and appears stated age Abdomen: soft, non-tender; non distended, no masses,  no organomegaly  Pelvic: External genitalia:  no lesions              Urethra:  normal appearing urethra with no masses, tenderness or lesions              Bartholins and Skenes: normal                  Vagina: normal appearing vagina with normal color and discharge, no lesions              Cervix: no cervical motion tenderness and no lesions              Bimanual Exam:  Uterus:  retroverted, normal sized, mildly tender, decreased mobility.               Adnexa: no mass, fullness, tenderness                Chaperone was present for exam.  Ultrasound images reviewed with the patient, her stripe is thickened at 14 mm with a question of a polyp.  ASSESSMENT Recurrent pelvic pain, no findings on ultrasound to explain her pain. Her uterus is tender, possible low level endometritis Thickened endometrium (cycle recently ended), u/s concerning for endometrial polyp    PLAN Will treat with doxycyline  Will return for sonohysterogram Will repeat exam at that visit to see if the uterine tenderness resolves.    This is my first visit with the patient, a full history, exam and review of ultrasound was done. Discussed management, medication prescribed. C/W level 3 visit

## 2020-11-18 ENCOUNTER — Encounter: Payer: Self-pay | Admitting: Obstetrics and Gynecology

## 2020-11-22 ENCOUNTER — Telehealth: Payer: Self-pay

## 2020-11-22 ENCOUNTER — Other Ambulatory Visit: Payer: Self-pay

## 2020-11-22 DIAGNOSIS — E282 Polycystic ovarian syndrome: Secondary | ICD-10-CM

## 2020-11-22 DIAGNOSIS — R9389 Abnormal findings on diagnostic imaging of other specified body structures: Secondary | ICD-10-CM

## 2020-11-22 DIAGNOSIS — N949 Unspecified condition associated with female genital organs and menstrual cycle: Secondary | ICD-10-CM

## 2020-11-22 DIAGNOSIS — R102 Pelvic and perineal pain: Secondary | ICD-10-CM

## 2020-11-22 NOTE — Telephone Encounter (Signed)
Tina Jensen  [facility] I am just following up to see if we can schedule the sonohysterogram that Dr Talbert Nan wanted me to have after my next menstrual period.   Thank you

## 2020-11-22 NOTE — Telephone Encounter (Signed)
Spoke with pt. Pt states needing to schedule SHGM with next cycle. LMP 11/29 and will start around 12/29. Pt scheduled for [facility] on 1/11 at 1 pm with Dr Talbert Nan with next available date for [place]. Pt agreeable to date and time of appt.  Cc: Hayley for precert  Orders placed by Dr Talbert Nan on 11/17/20 Encounter closed

## 2020-11-22 NOTE — Progress Notes (Signed)
SHGM orders placed per Dr Talbert Nan.  Encounter closed

## 2020-12-17 IMAGING — US US THYROID
1 series · 13 of 25 positions shown · non-contrast
Comparison: None.

CLINICAL DATA: Thyromegaly on exam

EXAM:
THYROID ULTRASOUND
TECHNIQUE: Ultrasound examination of the thyroid gland and adjacent soft
tissues was performed.

[Series 1: us thyroid · 0.08mm/px · 13 of 43 slices shown]
[im 1/43]
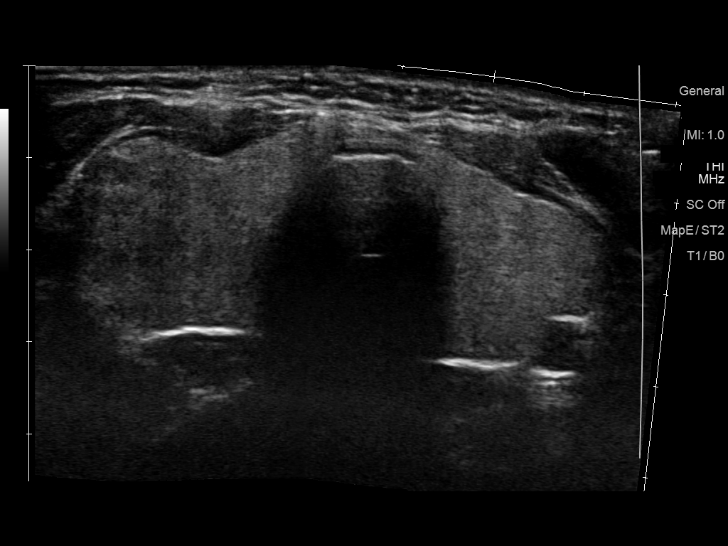
[im 4/43]
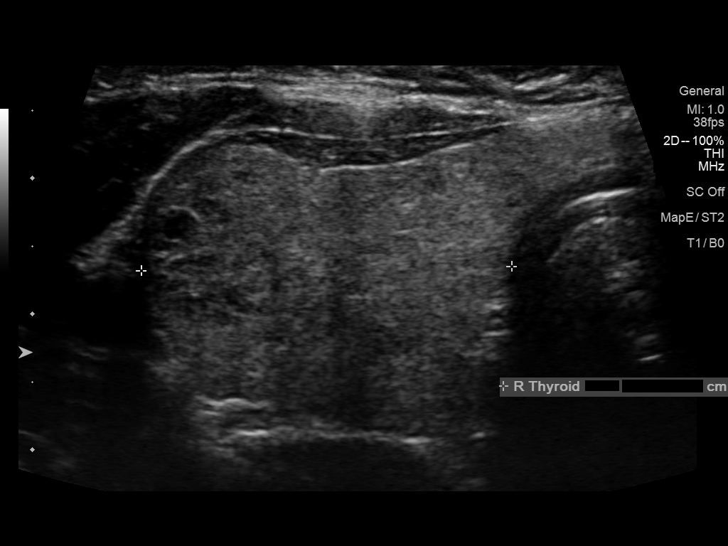
[im 8/43]
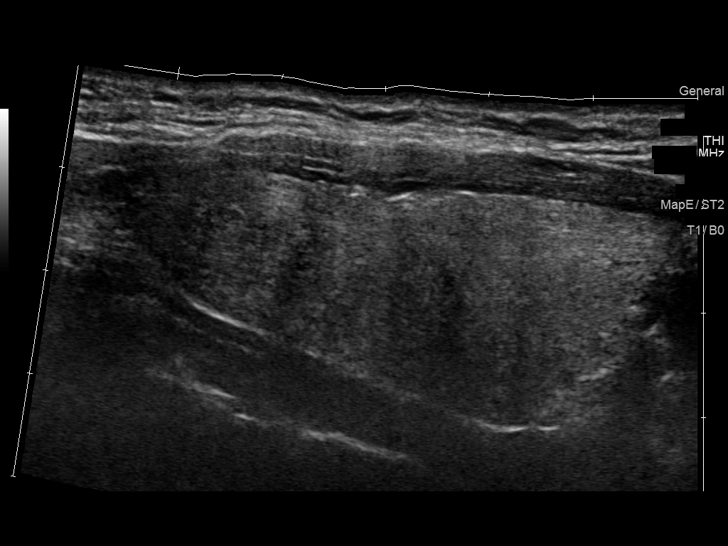
[im 11/43]
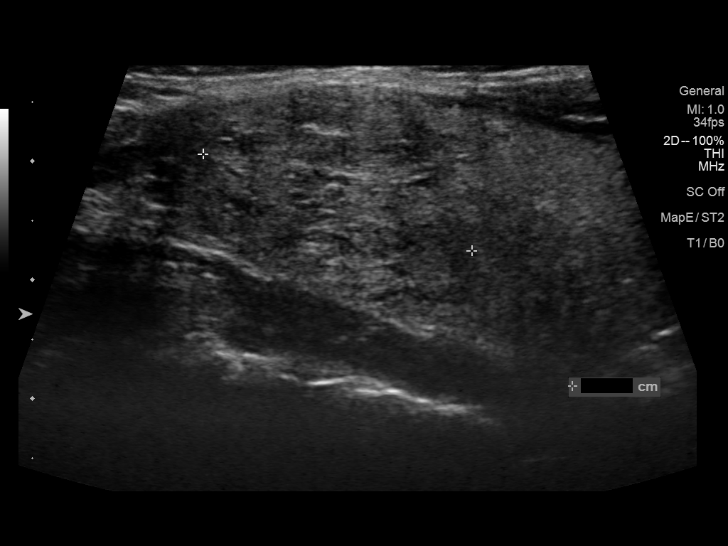
[im 15/43]
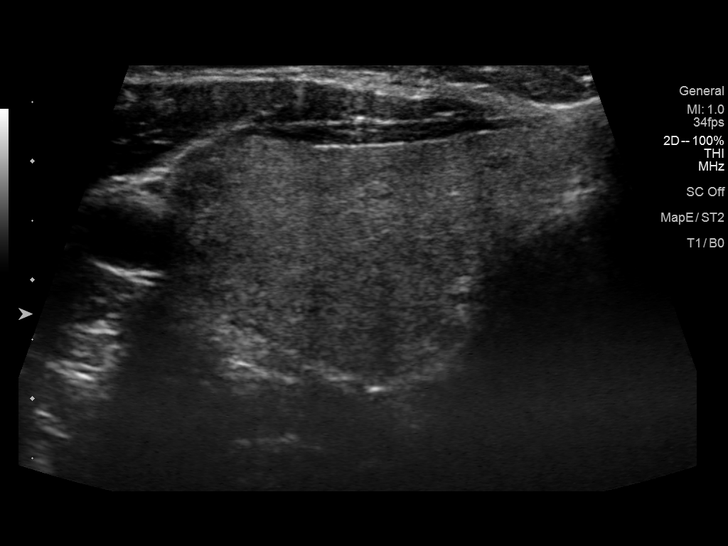
[im 18/43]
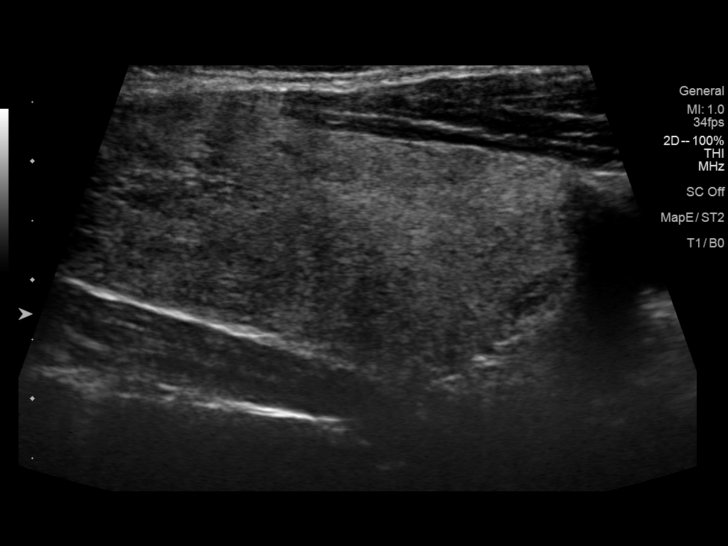
[im 22/43]
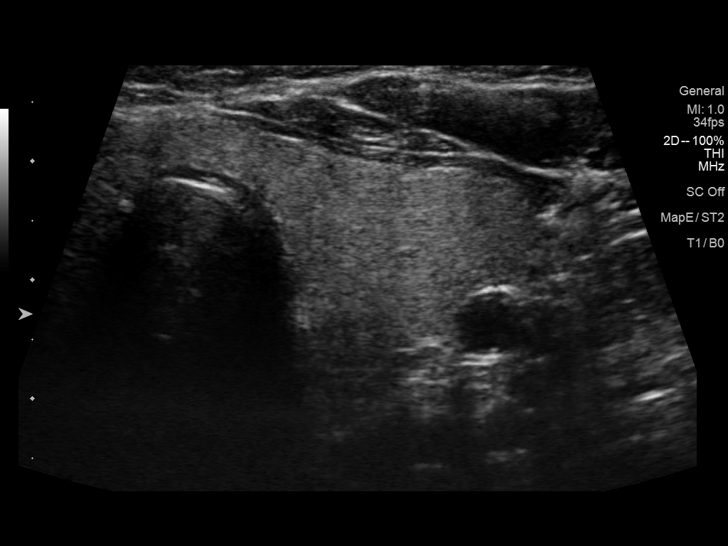
[im 25/43]
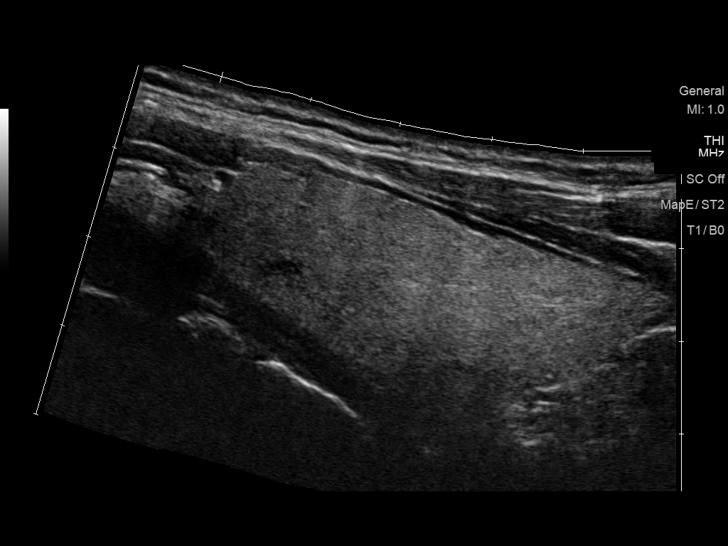
[im 29/43]
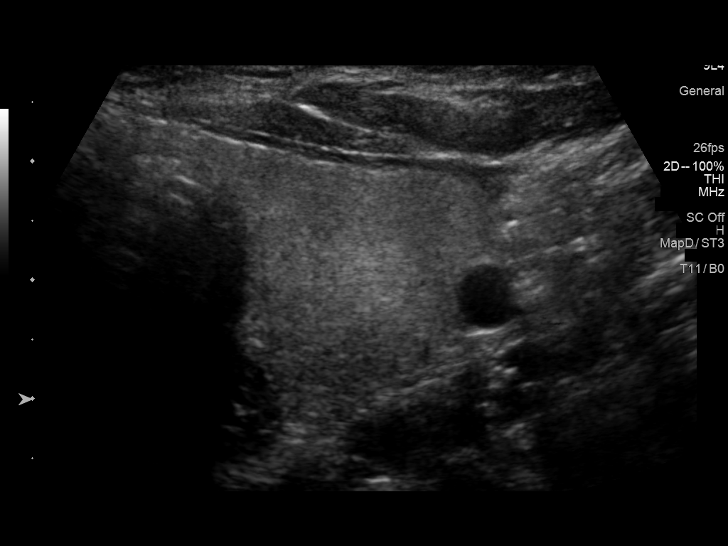
[im 32/43]
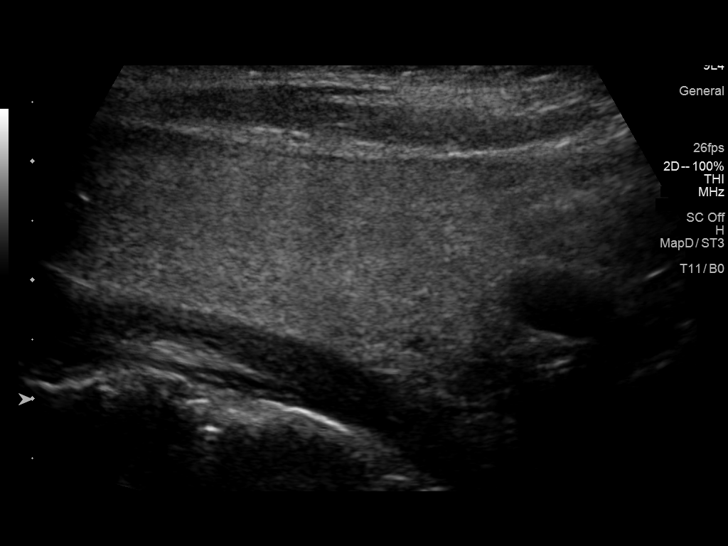
[im 36/43]
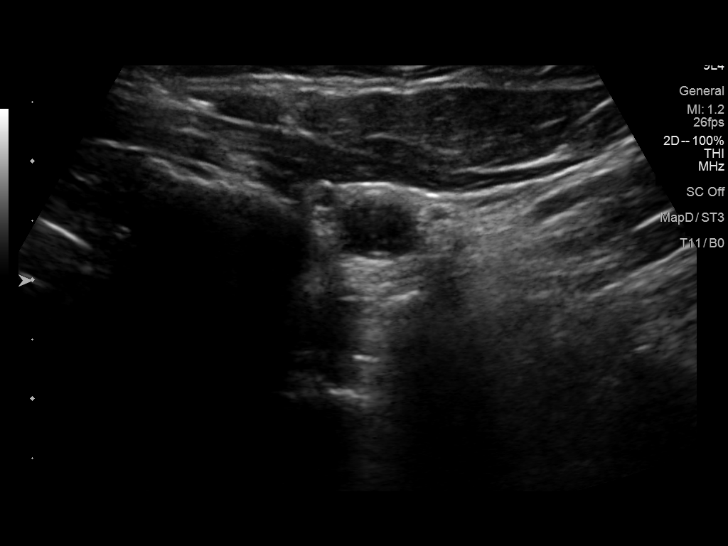
[im 39/43]
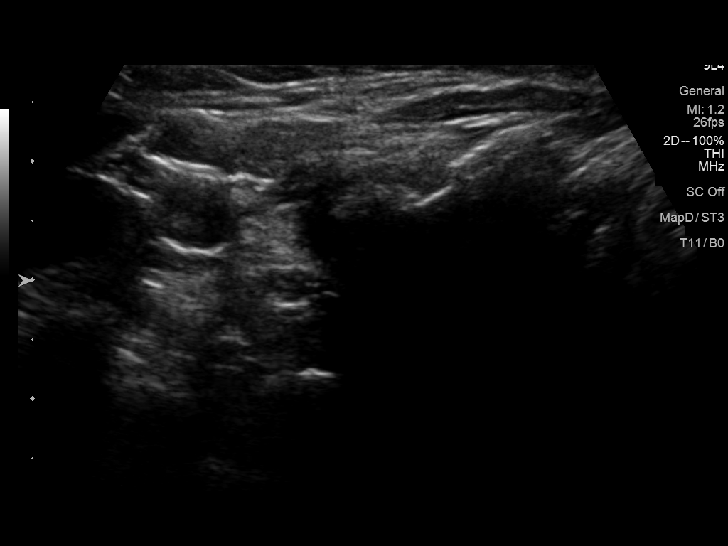
[im 43/43]
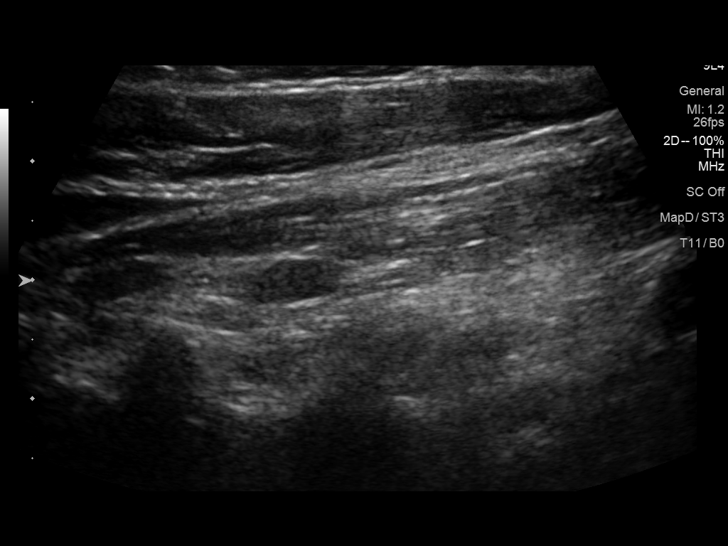

[13 of 25 positions shown; findings below may reference images not displayed]

FINDINGS: Parenchymal Echotexture: Mildly heterogenous

Isthmus: 8 mm

Right lobe: 5.0 x 2.2 x 2.7 cm

Left lobe: 5.1 x 2.1 x 2.2 cm

_________________________________________________________

Estimated total number of nodules >/= 1 cm: 1

Number of spongiform nodules >/=  2 cm not described below (TR1): 0

Number of mixed cystic and solid nodules >/= 1.5 cm not described
below (TR2): 0

_________________________________________________________

Nodule # 1:

Location: Right; Superior

Maximum size: 2.4 cm; Other 2 dimensions: 1.9 x 1.4 cm

Composition: solid/almost completely solid (2)

Echogenicity: isoechoic (1)

Shape: not taller-than-wide (0)

Margins: ill-defined (0)

Echogenic foci: none (0)

ACR TI-RADS total points: 3.

ACR TI-RADS risk category: TR3 (3 points).

ACR TI-RADS recommendations:

*Given size (>/= 1.5 - 2.4 cm) and appearance, a follow-up
ultrasound in 1 year should be considered based on TI-RADS criteria.

_________________________________________________________

There are additional bilateral cystic and hypoechoic subcentimeter
nodules noted all measuring 4 mm or less in size which would not
meet criteria for any biopsy or follow-up.

No hypervascularity or regional adenopathy.
IMPRESSION: 2.4 cm right superior TR 3 nodule meets criteria follow-up in 1
year.

The above is in keeping with the ACR TI-RADS recommendations - [HOSPITAL] 8558;[DATE].

## 2020-12-20 ENCOUNTER — Ambulatory Visit (INDEPENDENT_AMBULATORY_CARE_PROVIDER_SITE_OTHER): Payer: BC Managed Care – PPO

## 2020-12-20 ENCOUNTER — Ambulatory Visit (INDEPENDENT_AMBULATORY_CARE_PROVIDER_SITE_OTHER): Payer: BC Managed Care – PPO | Admitting: Obstetrics and Gynecology

## 2020-12-20 ENCOUNTER — Other Ambulatory Visit: Payer: Self-pay

## 2020-12-20 ENCOUNTER — Other Ambulatory Visit (HOSPITAL_COMMUNITY)
Admission: RE | Admit: 2020-12-20 | Discharge: 2020-12-20 | Disposition: A | Discharge status: Home / Self Care | Payer: BC Managed Care – PPO | Source: Ambulatory Visit | Attending: Obstetrics and Gynecology | Admitting: Obstetrics and Gynecology

## 2020-12-20 ENCOUNTER — Encounter: Payer: Self-pay | Admitting: Obstetrics and Gynecology

## 2020-12-20 VITALS — BP 124/72 | HR 88 | Ht 64.0 in | Wt 152.0 lb

## 2020-12-20 DIAGNOSIS — R9389 Abnormal findings on diagnostic imaging of other specified body structures: Secondary | ICD-10-CM | POA: Insufficient documentation

## 2020-12-20 DIAGNOSIS — R102 Pelvic and perineal pain: Secondary | ICD-10-CM

## 2020-12-20 DIAGNOSIS — N949 Unspecified condition associated with female genital organs and menstrual cycle: Secondary | ICD-10-CM | POA: Diagnosis not present

## 2020-12-20 DIAGNOSIS — E282 Polycystic ovarian syndrome: Secondary | ICD-10-CM

## 2020-12-20 MED ORDER — MEDROXYPROGESTERONE ACETATE 5 MG PO TABS: 5.0000 mg | ORAL_TABLET | Freq: Every day | ORAL | 0 refills | Status: DC

## 2020-12-20 NOTE — Progress Notes (Signed)
GYNECOLOGY  VISIT   HPI: 50 y.o.   Married White or Caucasian Not Hispanic or Latino  female   [phone] with Patient's last menstrual period was 12/09/2020.   here for sonohysterogram. She was seen last month for an ultrasound for pelvic pain. Ultrasound was only significant for a thickened endometrium concerning for a possible polyp. She was noted to have a tender uterus on exam and was treated for a possible endomtritis with doxycycline. She has regular, normal cycles. Since her last visit her pain was a little better. Her dysmenorrhea was much worse with her last cycle.  She used to go through a pad in 4-5 hours. In the last 6 months at the most she would change a pad in 6 hours. She is cycling monthly, last cycle was very light but very crampy. Last cycle lasted x 2 days.   GYNECOLOGIC HISTORY: Patient's last menstrual period was 12/09/2020. Contraception:vasectomy Menopausal hormone therapy: none        OB History    Gravida  6   Para      Term      Preterm      AB  4   Living  2     SAB  4   IAB      Ectopic      Multiple      Live Births                 Patient Active Problem List   Diagnosis Date Noted  . PCOS (polycystic ovarian syndrome) 10/09/2016  . Factor V Leiden carrier ([place]) 10/09/2016  . MTHFR mutation 10/09/2016    Past Medical History:  Diagnosis Date  . Clotting disorder ([place])    carrier for MTHFR & Factor 5 Liden   . Gestational diabetes    2006 & 2009   . Infertility, female   . PCOS (polycystic ovarian syndrome)     Past Surgical History:  Procedure Laterality Date  . COLPOSCOPY  1998  . IVF    . maxillofacial surgery      teenager due to TMJ  . TONSILLECTOMY      Current Outpatient Medications  Medication Sig Dispense Refill  . cetirizine (ZYRTEC) 10 MG tablet Take 10 mg by mouth as needed for allergies.    Marland Kitchen ibuprofen (ADVIL,MOTRIN) 200 MG tablet Take 600 mg by mouth as needed.    . rosuvastatin (CRESTOR) 5 MG tablet  Take 5 mg by mouth daily.    . SF 5000 PLUS 1.1 % CREA dental cream      No current facility-administered medications for this visit.     ALLERGIES: Patient has no known allergies.  Family History  Problem Relation Age of Onset  . Osteoporosis Mother   . Hyperlipidemia Mother   . Parkinson's disease Mother   . Diabetes Father   . Hyperlipidemia Father   . GER disease Father   . Cancer Father        CLL (Chronic Lymphocytic Leukemia)  . Osteoporosis Maternal Grandmother     Social History   Socioeconomic History  . Marital status: Married    Spouse name: Not on file  . Number of children: Not on file  . Years of education: Not on file  . Highest education level: Not on file  Occupational History  . Not on file  Tobacco Use  . Smoking status: Never Smoker  . Smokeless tobacco: Never Used  Vaping Use  . Vaping Use: Never used  Substance  and Sexual Activity  . Alcohol use: Not Currently  . Drug use: No  . Sexual activity: Yes    Comment: husband vasectomy  Other Topics Concern  . Not on file  Social History Narrative  . Not on file   Social Determinants of Health   Financial Resource Strain: Not on file  Food Insecurity: Not on file  Transportation Needs: Not on file  Physical Activity: Not on file  Stress: Not on file  Social Connections: Not on file  Intimate Partner Violence: Not on file    ROS  PHYSICAL EXAMINATION:    BP 124/72 (BP Location: Left Arm, Patient Position: Sitting, Cuff Size: Normal)   Pulse 88   Ht 5' 4"  (1.626 m)   Wt 152 lb (68.9 kg)   LMP 12/09/2020   BMI 26.09 kg/m     General appearance: alert, cooperative and appears stated age  Pelvic: External genitalia:  no lesions              Urethra:  normal appearing urethra with no masses, tenderness or lesions              Bartholins and Skenes: normal                 Vagina: normal appearing vagina with normal color and discharge, no lesions              Cervix: no lesions                 Pelvic ultrasound:  Indications: Thickened endometrium, possible polyp on ultrasound 11/17/20  Findings:  Uterus normal sized, not remeasured  Endometrium 17.99 mm  Left ovary 2.5 x 1.53 x 1.42 cm  Right ovary 2.19 x 1.63 x 1.16 cm  No free fluid  Sonohysterogram:  The procedure and risks of the procedure were reviewed with the patient, consent form was signed. A speculum was placed in the vagina and the cervix was cleansed with betadine. The sonohysterogram catheter was inserted into the uterine cavity without difficulty. Saline was infused under direct observation with the ultrasound. The endometrium appeared slightly polypoid, no distinct masses.The catheter was removed.    Impression:   Normal sized uterus with thickened endometrium, slight polypoid appearance on sonohysterogram.  Normal ovaries bilaterally       The risks of endometrial biopsy were reviewed and a consent was obtained.  A speculum was placed in the vagina and the cervix was cleansed with betadine. The mini-pipelle was placed into the endometrial cavity. The uterus sounded to ~8 cm. The endometrial biopsy was performed, taking care to get a representative sample, sampling 360 degrees of the uterine cavity, this was done x 2 with minimal tissue. Then changed to the regular pipelle, repeated the biopsy and a large amount of tissue was obtained. The speculum was removed. There were no complications.    Chaperone was present for exam.  1. Thickened endometrium -The patient is bleeding monthly but very lightly, it doesn't go along with the thickness of her lining. Wonder if she is truly cycling vs anovulatory bleed. We discussed this possibility.  -Sonohysterogram with thickened endometrium, ? Polypoid endometrium. - Surgical pathology( [place]/ [facility]) -Will try a provera w/d, will time with her cycle -Call with an up date after the provera. -Calendar all bleeding  2. Pelvic  pain -improved currently, s/p course of doxycycline -not a candidate for OCP's -she will let me know if the pain worsens.   In addition to reviewing the  ultrasound and performing the endometrial biopsy, management was discussed and medication was prescribed.

## 2020-12-20 NOTE — Patient Instructions (Signed)

## 2020-12-22 LAB — SURGICAL PATHOLOGY

## 2021-01-10 ENCOUNTER — Ambulatory Visit
Admission: RE | Admit: 2021-01-10 | Discharge: 2021-01-10 | Disposition: A | Discharge status: Home / Self Care | Payer: BC Managed Care – PPO | Source: Ambulatory Visit | Attending: Obstetrics & Gynecology | Admitting: Obstetrics & Gynecology

## 2021-01-10 ENCOUNTER — Other Ambulatory Visit: Payer: Self-pay

## 2021-01-10 DIAGNOSIS — E041 Nontoxic single thyroid nodule: Secondary | ICD-10-CM | POA: Diagnosis not present

## 2021-01-10 DIAGNOSIS — E049 Nontoxic goiter, unspecified: Secondary | ICD-10-CM

## 2021-01-25 ENCOUNTER — Other Ambulatory Visit: Payer: Self-pay | Admitting: Obstetrics & Gynecology

## 2021-01-25 ENCOUNTER — Other Ambulatory Visit: Payer: Self-pay | Admitting: Physical Medicine and Rehabilitation

## 2021-01-25 ENCOUNTER — Other Ambulatory Visit (HOSPITAL_BASED_OUTPATIENT_CLINIC_OR_DEPARTMENT_OTHER): Payer: Self-pay | Admitting: *Deleted

## 2021-01-25 DIAGNOSIS — E041 Nontoxic single thyroid nodule: Secondary | ICD-10-CM

## 2021-01-25 NOTE — Progress Notes (Unsigned)
Spoke to DRI at [facility] and they will call Reeanna to schedule FNA biopsy of right superior thyroid nodule. Sharrie Rothman CMA

## 2021-02-03 ENCOUNTER — Other Ambulatory Visit (HOSPITAL_COMMUNITY)
Admission: RE | Admit: 2021-02-03 | Discharge: 2021-02-03 | Disposition: A | Discharge status: Home / Self Care | Payer: BC Managed Care – PPO | Source: Ambulatory Visit | Attending: Radiology | Admitting: Radiology

## 2021-02-03 ENCOUNTER — Ambulatory Visit
Admission: RE | Admit: 2021-02-03 | Discharge: 2021-02-03 | Disposition: A | Discharge status: Home / Self Care | Payer: BC Managed Care – PPO | Source: Ambulatory Visit | Attending: Obstetrics & Gynecology | Admitting: Obstetrics & Gynecology

## 2021-02-03 DIAGNOSIS — E041 Nontoxic single thyroid nodule: Secondary | ICD-10-CM

## 2021-02-06 LAB — CYTOLOGY - NON PAP

## 2021-02-17 ENCOUNTER — Other Ambulatory Visit (HOSPITAL_COMMUNITY)
Admission: RE | Admit: 2021-02-17 | Discharge: 2021-02-17 | Disposition: A | Discharge status: Home / Self Care | Payer: BC Managed Care – PPO | Source: Ambulatory Visit | Attending: Obstetrics & Gynecology | Admitting: Obstetrics & Gynecology

## 2021-02-17 ENCOUNTER — Ambulatory Visit (INDEPENDENT_AMBULATORY_CARE_PROVIDER_SITE_OTHER): Payer: BC Managed Care – PPO | Admitting: Obstetrics & Gynecology

## 2021-02-17 ENCOUNTER — Other Ambulatory Visit: Payer: Self-pay

## 2021-02-17 ENCOUNTER — Encounter (HOSPITAL_BASED_OUTPATIENT_CLINIC_OR_DEPARTMENT_OTHER): Payer: Self-pay | Admitting: Obstetrics & Gynecology

## 2021-02-17 VITALS — BP 124/86 | Ht 64.0 in | Wt 151.8 lb

## 2021-02-17 DIAGNOSIS — D6851 Activated protein C resistance: Secondary | ICD-10-CM

## 2021-02-17 DIAGNOSIS — E041 Nontoxic single thyroid nodule: Secondary | ICD-10-CM | POA: Diagnosis not present

## 2021-02-17 DIAGNOSIS — Z8632 Personal history of gestational diabetes: Secondary | ICD-10-CM | POA: Diagnosis not present

## 2021-02-17 DIAGNOSIS — Z01419 Encounter for gynecological examination (general) (routine) without abnormal findings: Secondary | ICD-10-CM

## 2021-02-17 DIAGNOSIS — Z124 Encounter for screening for malignant neoplasm of cervix: Secondary | ICD-10-CM | POA: Insufficient documentation

## 2021-02-17 NOTE — Progress Notes (Signed)
50 y.o. [id number] Married White or Caucasian female here for annual exam.  Had pelvic pain in the winter.  Had ultrasound.  Endometrium was thickened and looked like a possible polyp.  SHGM did not confirm this.  Had endometrial biopsy that showed benign tissue.  Cycles are about every 30 days        Sexually active: Yes.    The current method of family planning is vasectomy.    Exercising: No.  Does walk her dog Smoker:  no  Health Maintenance: Pap:  Obtain today History of abnormal Pap:  Remote hx.  Last abnormal pap >20 years ago MMG:  05/2020 Colonoscopy:  03/02/2019 cologuard was negative TDaP:  12/30/2018 Hep C testing: d/w pt Screening Labs: Dr. Forde Dandy, last appt 05/2020   reports that she has never smoked. She has never used smokeless tobacco. She reports previous alcohol use. She reports that she does not use drugs.  Past Medical History:  Diagnosis Date  . Clotting disorder ([place])    carrier for MTHFR & Factor 5 Liden   . Gestational diabetes    2006 & 2009   . Infertility, female   . PCOS (polycystic ovarian syndrome)     Past Surgical History:  Procedure Laterality Date  . COLPOSCOPY  1998  . IVF    . maxillofacial surgery      teenager due to TMJ  . TONSILLECTOMY      Current Outpatient Medications  Medication Sig Dispense Refill  . cetirizine (ZYRTEC) 10 MG tablet Take 10 mg by mouth as needed for allergies.    Marland Kitchen ibuprofen (ADVIL,MOTRIN) 200 MG tablet Take 600 mg by mouth as needed.    . rosuvastatin (CRESTOR) 5 MG tablet Take 5 mg by mouth daily.    . SF 5000 PLUS 1.1 % CREA dental cream      No current facility-administered medications for this visit.    Family History  Problem Relation Age of Onset  . Osteoporosis Mother   . Hyperlipidemia Mother   . Parkinson's disease Mother   . Diabetes Father   . Hyperlipidemia Father   . GER disease Father   . Cancer Father        CLL (Chronic Lymphocytic Leukemia)  . Osteoporosis Maternal Grandmother      Review of Systems  All other systems reviewed and are negative.   Exam:   BP 124/86   Ht 5' 4"  (1.626 m)   Wt 151 lb 12.8 oz (68.9 kg)   BMI 26.06 kg/m   Height: 5' 4"  (162.6 cm)  General appearance: alert, cooperative and appears stated age Head: Normocephalic, without obvious abnormality, atraumatic Neck: no adenopathy, supple, symmetrical, trachea midline and thyroid normal to inspection and palpation Lungs: clear to auscultation bilaterally Breasts: normal appearance, no masses or tenderness Heart: regular rate and rhythm Abdomen: soft, non-tender; bowel sounds normal; no masses,  no organomegaly Extremities: extremities normal, atraumatic, no cyanosis or edema Skin: Skin color, texture, turgor normal. No rashes or lesions Lymph nodes: Cervical, supraclavicular, and axillary nodes normal. No abnormal inguinal nodes palpated Neurologic: Grossly normal   Pelvic: External genitalia:  no lesions              Urethra:  normal appearing urethra with no masses, tenderness or lesions              Bartholins and Skenes: normal                 Vagina: normal appearing vagina with  normal color and discharge, no lesions              Cervix: no lesions              Pap taken: Yes.   Bimanual Exam:  Uterus:  normal size, contour, position, consistency, mobility, non-tender              Adnexa: normal adnexa and no mass, fullness, tenderness               Rectovaginal: Confirms               Anus:  normal sphincter tone, no lesions  Chaperone, Mickey Farber, CMA, was present for exam.  Assessment/Plan: 1. Well woman exam with routine gynecological exam - pap and HR HPV obtained today - MMG up to date - cologuard 2020.  Repeat next year or proceed with colonoscopy. - vaccines up to date - lab work done with Dr. Forde Dandy  2. Factor V Leiden carrier ([place])  3. Right thyroid nodule - biopsy showed follicular nodule  4. History of gestational diabetes

## 2021-02-22 LAB — CYTOLOGY - PAP
Adequacy: ABSENT
Comment: NEGATIVE
Diagnosis: NEGATIVE
Diagnosis: REACTIVE
High risk HPV: NEGATIVE

## 2021-03-21 ENCOUNTER — Ambulatory Visit: Payer: BC Managed Care – PPO

## 2021-04-06 DIAGNOSIS — J029 Acute pharyngitis, unspecified: Secondary | ICD-10-CM | POA: Diagnosis not present

## 2021-04-06 DIAGNOSIS — U071 COVID-19: Secondary | ICD-10-CM | POA: Diagnosis not present

## 2021-04-28 IMAGING — MG DIGITAL SCREENING BILAT W/ TOMO W/ CAD
8 series · 8 of 24 positions shown · non-contrast
Comparison: None.

CLINICAL DATA: Screening.

EXAM:
DIGITAL SCREENING BILATERAL MAMMOGRAM WITH TOMO AND CAD

[L MLO synth-2D]
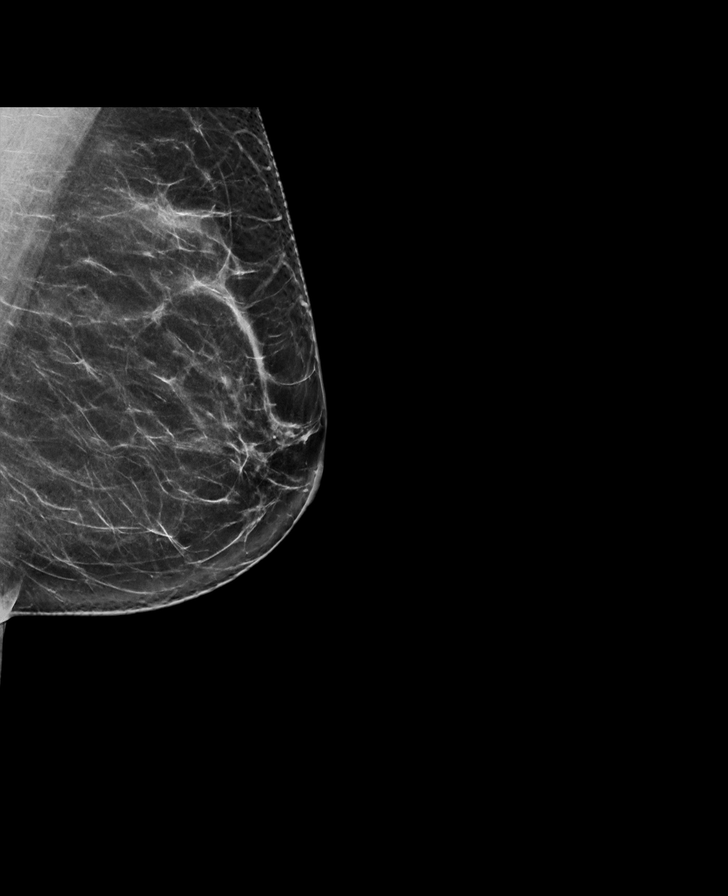

[R CC synth-2D]
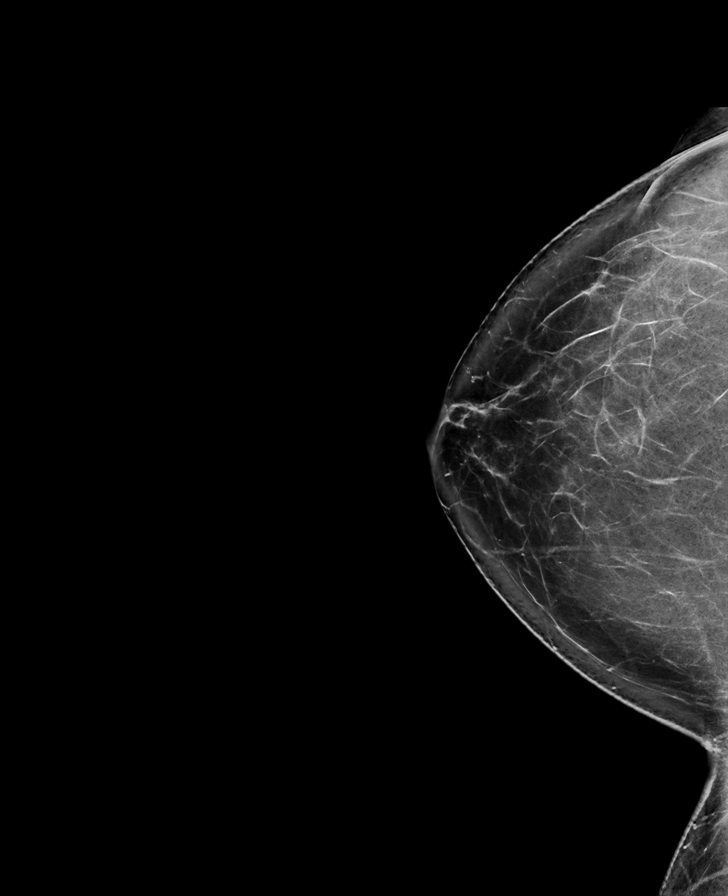

[L CC synth-2D]
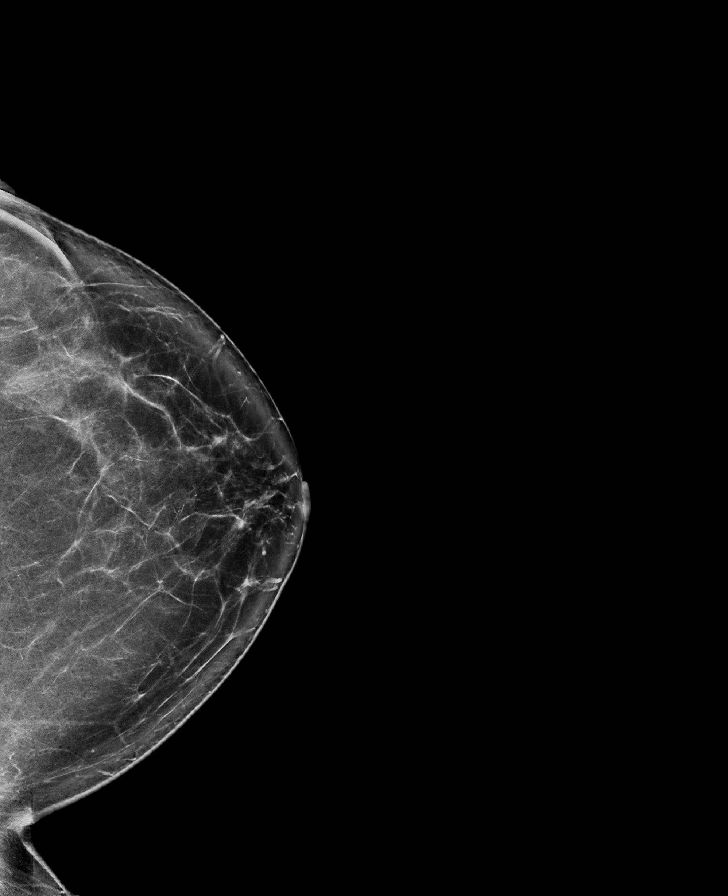

[R MLO synth-2D]
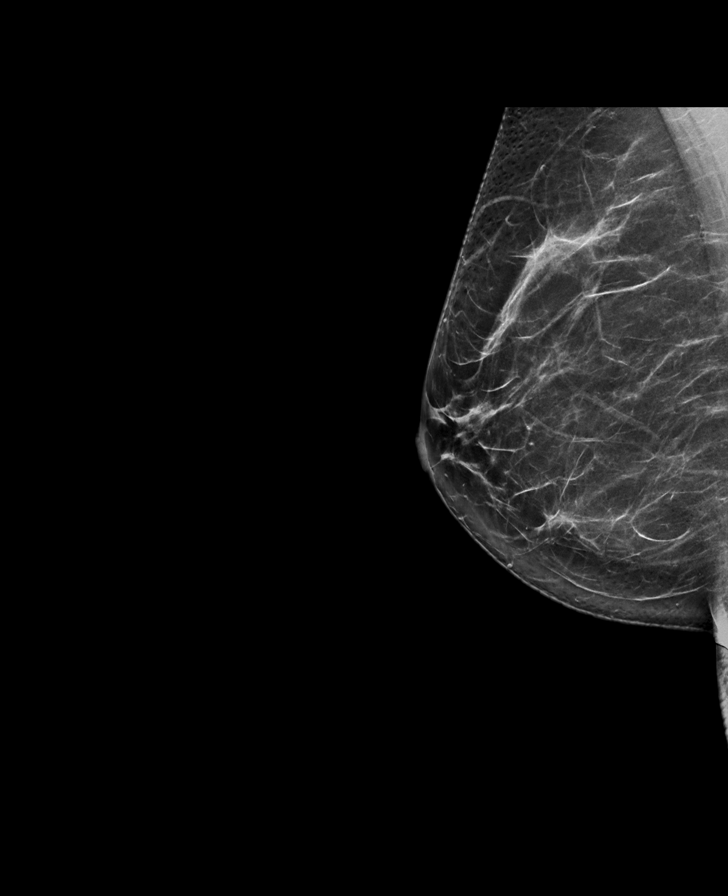

[L MLO tomo · tomo slice 40/79.0]
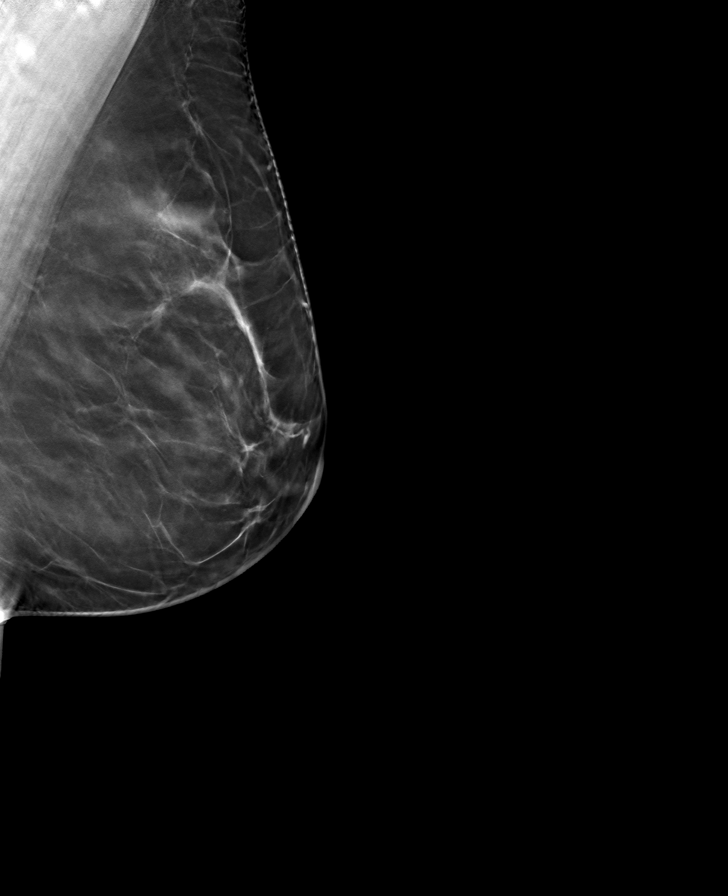

[R MLO tomo · tomo slice 39/77.0]
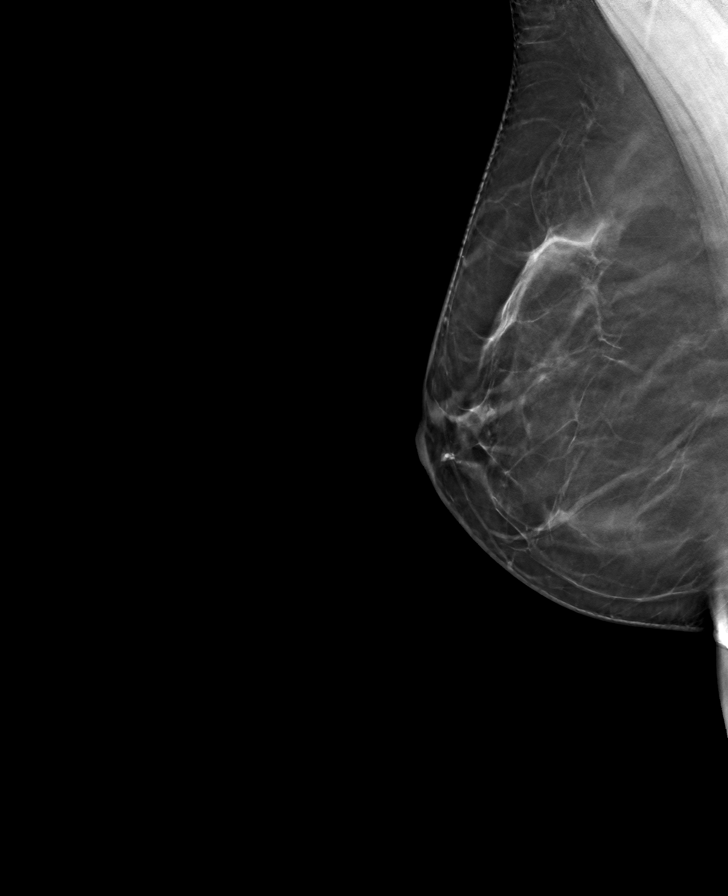

[L CC tomo · tomo slice 45/89.0]
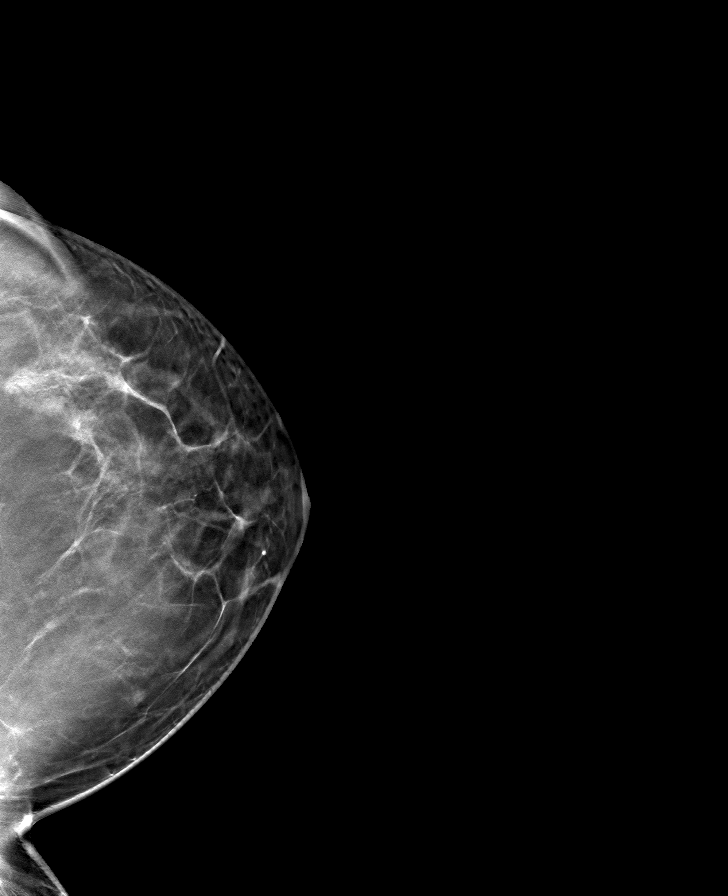

[R CC tomo · tomo slice 45/88.0]
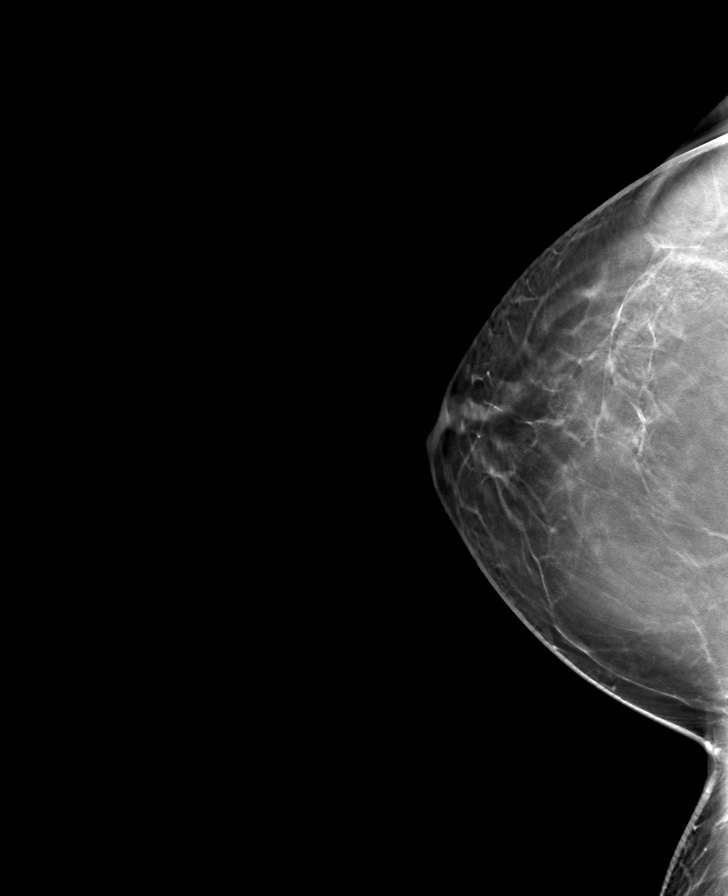

[8 of 24 positions shown; findings below may reference images not displayed]

ACR Breast Density Category b: There are scattered areas of
fibroglandular density.
FINDINGS: There are no findings suspicious for malignancy. Images were
processed with CAD.
IMPRESSION: No mammographic evidence of malignancy. A result letter of this
screening mammogram will be mailed directly to the patient.

RECOMMENDATION:
Screening mammogram in one year. (Code:Y5-G-EJ6)

BI-RADS CATEGORY  1: Negative.

## 2021-06-07 DIAGNOSIS — R7302 Impaired glucose tolerance (oral): Secondary | ICD-10-CM | POA: Diagnosis not present

## 2021-06-07 DIAGNOSIS — E785 Hyperlipidemia, unspecified: Secondary | ICD-10-CM | POA: Diagnosis not present

## 2021-06-07 DIAGNOSIS — E049 Nontoxic goiter, unspecified: Secondary | ICD-10-CM | POA: Diagnosis not present

## 2021-07-25 DIAGNOSIS — H18623 Keratoconus, unstable, bilateral: Secondary | ICD-10-CM | POA: Diagnosis not present

## 2021-09-30 DIAGNOSIS — Z23 Encounter for immunization: Secondary | ICD-10-CM | POA: Diagnosis not present

## 2021-10-11 ENCOUNTER — Other Ambulatory Visit: Payer: Self-pay | Admitting: Obstetrics & Gynecology

## 2021-10-11 DIAGNOSIS — Z1231 Encounter for screening mammogram for malignant neoplasm of breast: Secondary | ICD-10-CM

## 2021-10-14 ENCOUNTER — Ambulatory Visit
Admission: RE | Admit: 2021-10-14 | Discharge: 2021-10-14 | Disposition: A | Discharge status: Home / Self Care | Payer: BC Managed Care – PPO | Source: Ambulatory Visit

## 2021-10-14 ENCOUNTER — Other Ambulatory Visit: Payer: Self-pay

## 2021-10-14 DIAGNOSIS — Z1231 Encounter for screening mammogram for malignant neoplasm of breast: Secondary | ICD-10-CM

## 2021-11-08 DIAGNOSIS — D2272 Melanocytic nevi of left lower limb, including hip: Secondary | ICD-10-CM | POA: Diagnosis not present

## 2021-11-08 DIAGNOSIS — D2262 Melanocytic nevi of left upper limb, including shoulder: Secondary | ICD-10-CM | POA: Diagnosis not present

## 2021-11-08 DIAGNOSIS — D2261 Melanocytic nevi of right upper limb, including shoulder: Secondary | ICD-10-CM | POA: Diagnosis not present

## 2021-11-08 DIAGNOSIS — D2271 Melanocytic nevi of right lower limb, including hip: Secondary | ICD-10-CM | POA: Diagnosis not present

## 2022-01-08 DIAGNOSIS — E282 Polycystic ovarian syndrome: Secondary | ICD-10-CM | POA: Diagnosis not present

## 2022-01-08 DIAGNOSIS — R7302 Impaired glucose tolerance (oral): Secondary | ICD-10-CM | POA: Diagnosis not present

## 2022-01-15 IMAGING — US US FNA BIOPSY THYROID 1ST LESION
1 series · 12 of 12 positions shown · non-contrast
Comparison: Ultrasound thyroid dated January 10, 2021

MEDICATIONS:
Lidocaine 1% 2 mL

COMPLICATIONS:
None immediate.

INDICATION: Indeterminate thyroid nodule

EXAM:
ULTRASOUND GUIDED FINE NEEDLE ASPIRATION OF INDETERMINATE THYROID
NODULE
TECHNIQUE: Informed written consent was obtained from the patient after a
discussion of the risks, benefits and alternatives to treatment.
Questions regarding the procedure were encouraged and answered. A
timeout was performed prior to the initiation of the procedure.

[Series 1: us fna biopsy thyroid 1st lesion · 0.06mm/px · 12 acquisitions, 12 frames shown]
[im 1/12]
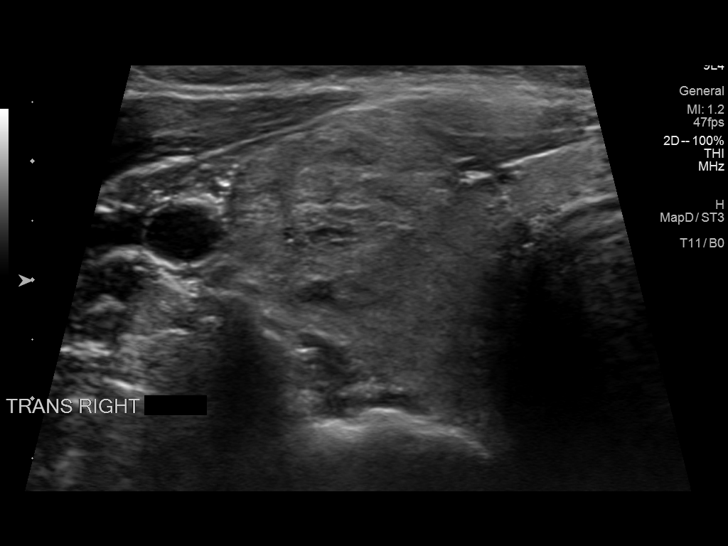
[im 2/12]
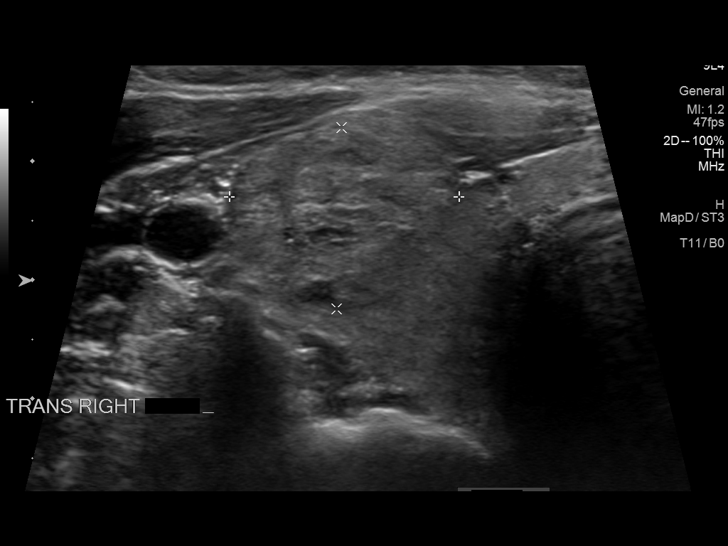
[im 3/12]
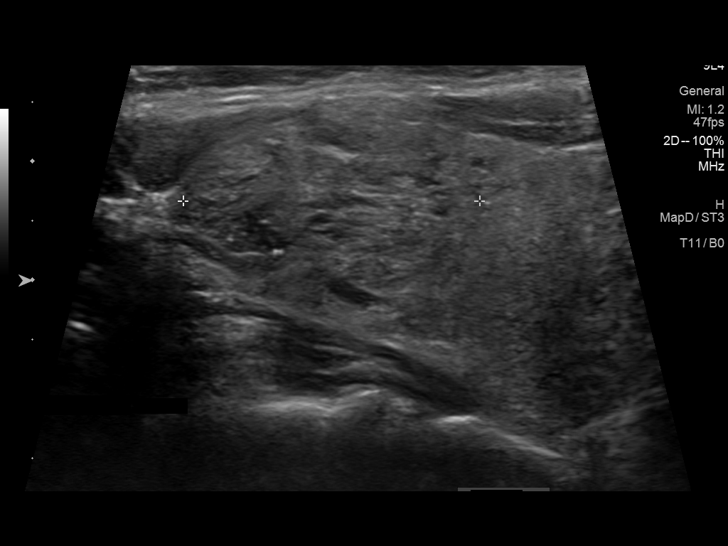
[im 4/12]
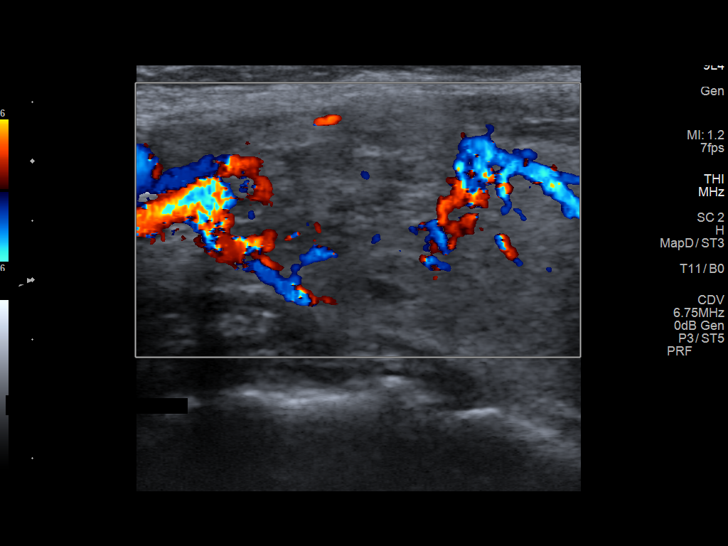
[im 5/12]
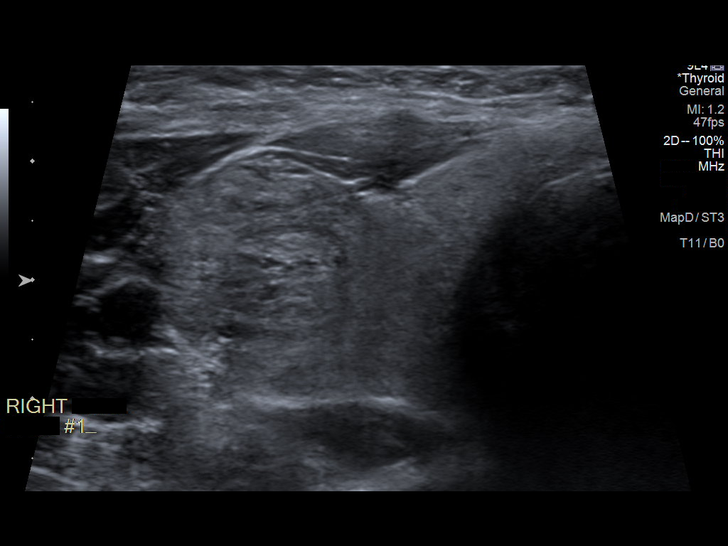
[im 6/12]
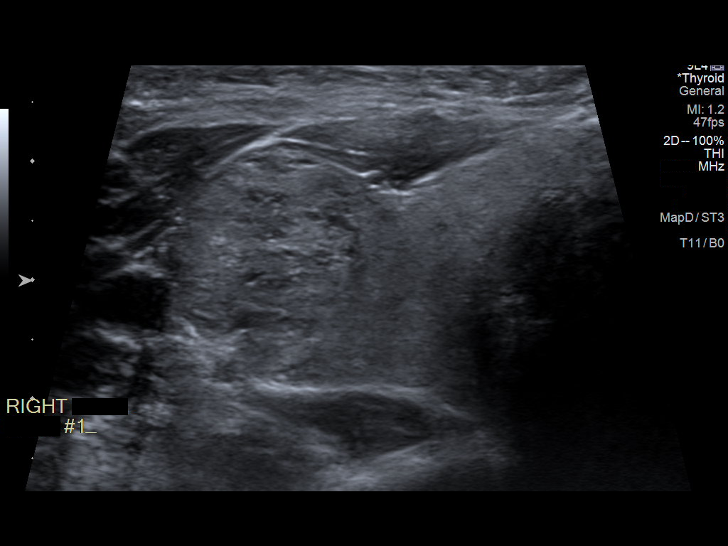
[im 7/12]
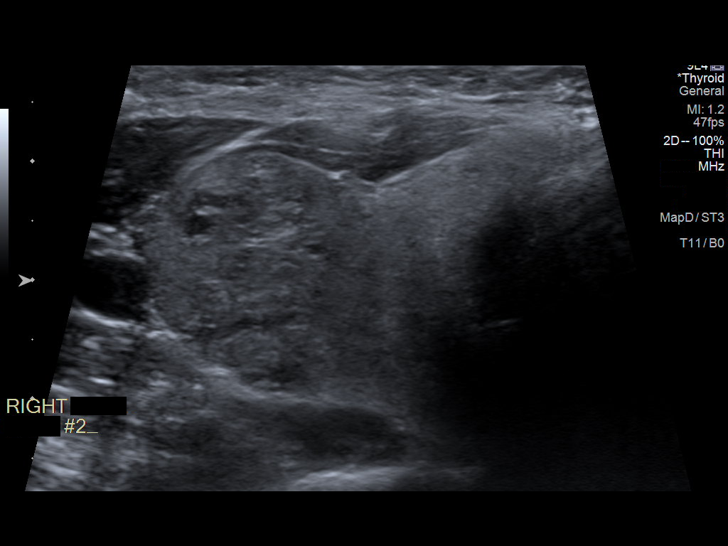
[im 8/12]
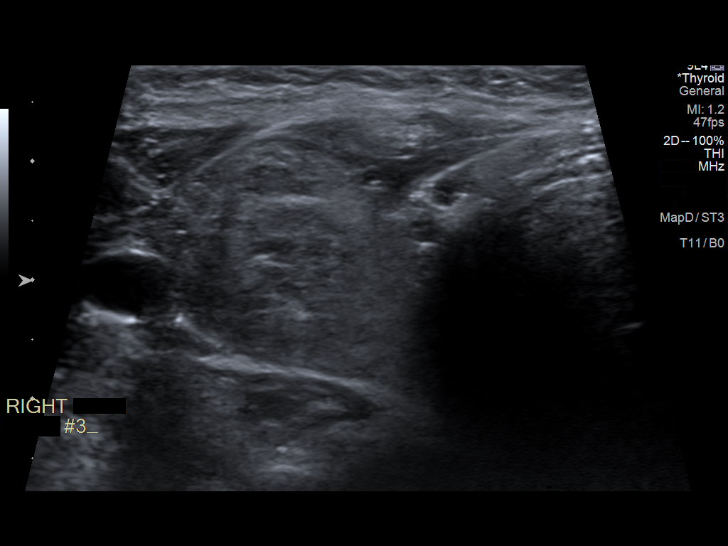
[im 9/12]
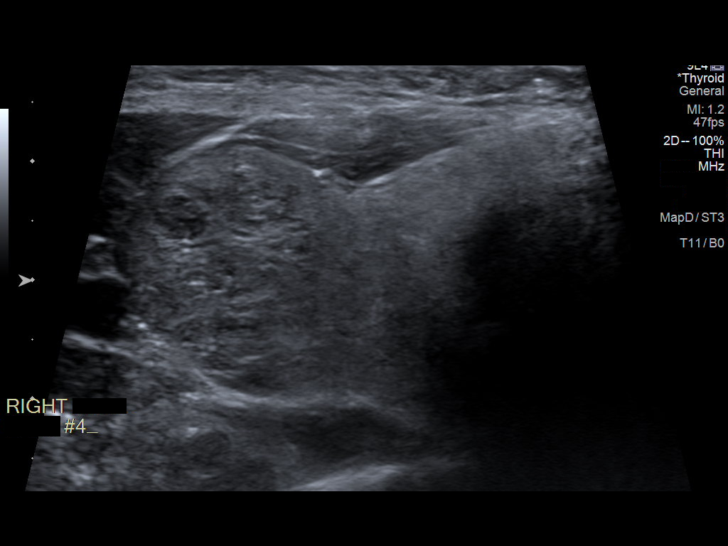
[im 10/12]
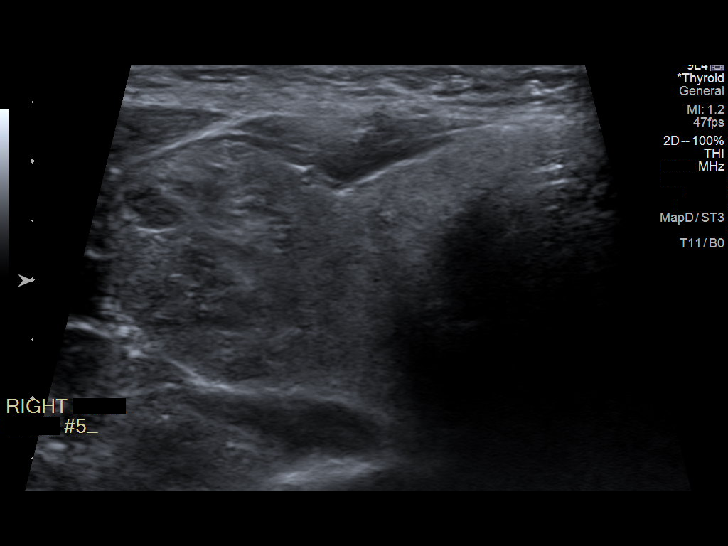
[im 11/12]
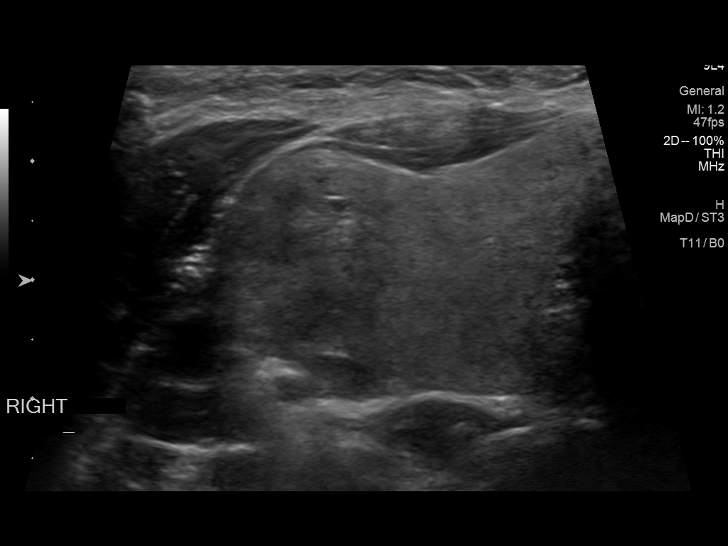
[im 12/12]
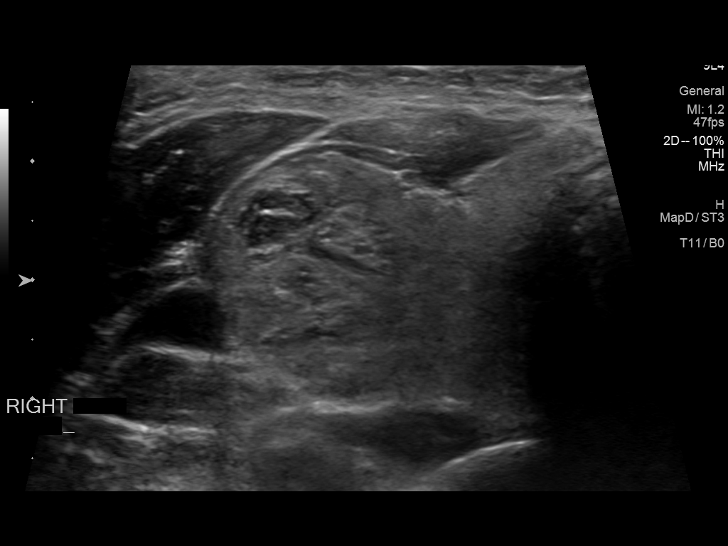

[12 of 12 positions shown; findings below may reference images not displayed]

Pre-procedural ultrasound scanning demonstrated unchanged size and
appearance of the indeterminate nodule within the superior aspect of
the right thyroid

The procedure was planned. The neck was prepped in the usual sterile
fashion, and a sterile drape was applied covering the operative
field. A timeout was performed prior to the initiation of the
procedure. Local anesthesia was provided with 1% lidocaine.

Under direct ultrasound guidance, 4 FNA biopsies were performed of
the superior aspect of the right thyroid with a 25 gauge needle.

Two samples were sent to [facility] per ordering [facility].

Multiple ultrasound images were saved for procedural documentation
purposes. The samples were prepared and submitted to pathology.

Limited post procedural scanning was negative for hematoma or
additional complication. Dressings were placed. The patient
tolerated the above procedures procedure well without immediate
postprocedural complication.
FINDINGS: Nodule reference number based on prior diagnostic ultrasound: 1

Maximum size: 2.6 cm

Location: Right; Mid

ACR TI-RADS risk category: TR3 (3 points)

Reason for biopsy: meets ACR TI-RADS criteria

Ultrasound imaging confirms appropriate placement of the needles
within the thyroid nodule.
IMPRESSION: Technically successful ultrasound guided fine needle aspiration of
superior right thyroid nodule.

Read by: Ourari Tiger, NP

## 2022-02-19 ENCOUNTER — Ambulatory Visit (HOSPITAL_BASED_OUTPATIENT_CLINIC_OR_DEPARTMENT_OTHER): Payer: BC Managed Care – PPO | Admitting: Obstetrics & Gynecology

## 2022-02-26 ENCOUNTER — Encounter (HOSPITAL_BASED_OUTPATIENT_CLINIC_OR_DEPARTMENT_OTHER): Payer: Self-pay | Admitting: Obstetrics & Gynecology

## 2022-02-26 ENCOUNTER — Ambulatory Visit (INDEPENDENT_AMBULATORY_CARE_PROVIDER_SITE_OTHER): Payer: BC Managed Care – PPO | Admitting: Obstetrics & Gynecology

## 2022-02-26 ENCOUNTER — Other Ambulatory Visit: Payer: Self-pay

## 2022-02-26 VITALS — BP 122/74 | HR 83 | Ht 64.0 in | Wt 151.4 lb

## 2022-02-26 DIAGNOSIS — D6851 Activated protein C resistance: Secondary | ICD-10-CM | POA: Diagnosis not present

## 2022-02-26 DIAGNOSIS — R7303 Prediabetes: Secondary | ICD-10-CM

## 2022-02-26 DIAGNOSIS — R6882 Decreased libido: Secondary | ICD-10-CM | POA: Diagnosis not present

## 2022-02-26 DIAGNOSIS — Z01419 Encounter for gynecological examination (general) (routine) without abnormal findings: Secondary | ICD-10-CM

## 2022-02-26 DIAGNOSIS — Z1211 Encounter for screening for malignant neoplasm of colon: Secondary | ICD-10-CM

## 2022-02-26 DIAGNOSIS — E041 Nontoxic single thyroid nodule: Secondary | ICD-10-CM

## 2022-02-26 NOTE — Progress Notes (Signed)
51 y.o. [id number] Married White or Caucasian female here for annual exam.  Doing well.  Just saw Dr. Forde Dandy and has a 6 month follow up planned.  HbA1C was 6.1. ? ?Cycles are still regular.  H/o hx pelvic pain in 2021.  Possible polyp present that was not confirmed with [facility].  Endometrial biopsy was normal. ? ?Mother, with Parkinson's,  moved in with pt in January. ? ?Having decreased libido issues that is creating some discourse.  Would consider treatment.  Awakenings counseling discussed. ? ?Patient's last menstrual period was 02/19/2022.          ?Sexually active: Yes.    ?The current method of family planning is vasectomy.    ?Exercising: Yes.     walking ?Smoker:  no ? ?Health Maintenance: ?Pap:  02/17/2021 Negative ?History of abnormal Pap:  remote hx >20 years ago ?MMG:  10/14/2021 Negative ?Colonoscopy:  02/2019.  Ordered today. ?BMD:   guidelines reviewed  ?Screening Labs: does with Dr. Forde Dandy ? ? reports that she has never smoked. She has never used smokeless tobacco. She reports that she does not currently use alcohol. She reports that she does not use drugs. ? ?Past Medical History:  ?Diagnosis Date  ? Clotting disorder ([place])   ? carrier for MTHFR & Factor 5 Liden   ? Gestational diabetes   ? 2006 & 2009   ? Infertility, female   ? PCOS (polycystic ovarian syndrome)   ? Prediabetes   ? ? ?Past Surgical History:  ?Procedure Laterality Date  ? COLPOSCOPY  1998  ? IVF    ? maxillofacial surgery     ? teenager due to TMJ  ? TONSILLECTOMY    ? ? ?Current Outpatient Medications  ?Medication Sig Dispense Refill  ? cetirizine (ZYRTEC) 10 MG tablet Take 10 mg by mouth as needed for allergies.    ? ibuprofen (ADVIL,MOTRIN) 200 MG tablet Take 600 mg by mouth as needed.    ? rosuvastatin (CRESTOR) 5 MG tablet Take 5 mg by mouth daily.    ? SF 5000 PLUS 1.1 % CREA dental cream     ? ?No current facility-administered medications for this visit.  ? ? ?Family History  ?Problem Relation Age of Onset  ? Osteoporosis Mother   ?  Hyperlipidemia Mother   ? Parkinson's disease Mother   ? Diabetes Father   ? Hyperlipidemia Father   ? GER disease Father   ? Cancer Father   ?     CLL (Chronic Lymphocytic Leukemia)  ? Osteoporosis Maternal Grandmother   ? ? ?Review of Systems  ?All other systems reviewed and are negative. ? ?Exam:   ?BP 122/74 (BP Location: Right Arm, Patient Position: Sitting, Cuff Size: Large)   Pulse 83   Ht 5' 4"  (1.626 m) Comment: reported  Wt 151 lb 6.4 oz (68.7 kg)   LMP 02/19/2022   BMI 25.99 kg/m?   Height: 5' 4"  (162.6 cm) (reported) ? ?General appearance: alert, cooperative and appears stated age ?Head: Normocephalic, without obvious abnormality, atraumatic ?Neck: no adenopathy, supple, symmetrical, trachea midline and thyroid with enlargement on the right ?Lungs: clear to auscultation bilaterally ?Breasts: normal appearance, no masses or tenderness ?Heart: regular rate and rhythm ?Abdomen: soft, non-tender; bowel sounds normal; no masses,  no organomegaly ?Extremities: extremities normal, atraumatic, no cyanosis or edema ?Skin: Skin color, texture, turgor normal. No rashes or lesions ?Lymph nodes: Cervical, supraclavicular, and axillary nodes normal. ?No abnormal inguinal nodes palpated ?Neurologic: Grossly normal ? ? ?Pelvic: External genitalia:  no lesions ?             Urethra:  normal appearing urethra with no masses, tenderness or lesions ?             Bartholins and Skenes: normal    ?             Vagina: normal appearing vagina with normal color and no discharge, no lesions ?             Cervix: no lesions ?             Pap taken: No. ?Bimanual Exam:  Uterus:  normal size, contour, position, consistency, mobility, non-tender ?             Adnexa: normal adnexa and no mass, fullness, tenderness ?              Rectovaginal: Confirms ?              Anus:  normal sphincter tone, no lesions ? ?Chaperone, Octaviano Batty, CMA, was present for exam. ? ?Assessment/Plan: ?1. Well woman exam with routine gynecological  exam ?- pap neg with neg HR HPV 2022 ?- MMG 10/2021 ?- cologuard negative 2020 ?- vaccines reviewed/updated ?- lab work done with Dr. Forde Dandy ? ?2. Colon cancer screening ?- Cologuard ? ?3. Factor V Leiden carrier ([place]) ? ?4. Pre-diabetes ?- followed by Dr. Forde Dandy ? ?5. Thyroid nodule ?- biopsied 2022 ? ?6.  Decreased libido ?- total testosterone level obtained today ? ? ?

## 2022-02-27 LAB — TESTOSTERONE, TOTAL, LC/MS/MS: Testosterone, total: 20.7 ng/dL

## 2022-05-23 DIAGNOSIS — Z1211 Encounter for screening for malignant neoplasm of colon: Secondary | ICD-10-CM | POA: Diagnosis not present

## 2022-06-02 LAB — COLOGUARD: COLOGUARD: NEGATIVE

## 2022-06-25 ENCOUNTER — Other Ambulatory Visit (HOSPITAL_BASED_OUTPATIENT_CLINIC_OR_DEPARTMENT_OTHER): Payer: BC Managed Care – PPO

## 2022-06-25 ENCOUNTER — Encounter (HOSPITAL_BASED_OUTPATIENT_CLINIC_OR_DEPARTMENT_OTHER): Payer: Self-pay

## 2022-06-25 ENCOUNTER — Other Ambulatory Visit (HOSPITAL_BASED_OUTPATIENT_CLINIC_OR_DEPARTMENT_OTHER): Payer: Self-pay

## 2022-06-25 ENCOUNTER — Other Ambulatory Visit (HOSPITAL_BASED_OUTPATIENT_CLINIC_OR_DEPARTMENT_OTHER): Payer: Self-pay | Admitting: Obstetrics & Gynecology

## 2022-06-25 DIAGNOSIS — R6882 Decreased libido: Secondary | ICD-10-CM

## 2022-06-29 LAB — TESTOSTERONE, TOTAL, LC/MS/MS: Testosterone, total: 38.3 ng/dL

## 2022-07-11 DIAGNOSIS — E049 Nontoxic goiter, unspecified: Secondary | ICD-10-CM | POA: Diagnosis not present

## 2022-07-11 DIAGNOSIS — R7302 Impaired glucose tolerance (oral): Secondary | ICD-10-CM | POA: Diagnosis not present

## 2022-07-11 DIAGNOSIS — E785 Hyperlipidemia, unspecified: Secondary | ICD-10-CM | POA: Diagnosis not present

## 2022-07-30 DIAGNOSIS — H18623 Keratoconus, unstable, bilateral: Secondary | ICD-10-CM | POA: Diagnosis not present

## 2022-08-31 ENCOUNTER — Other Ambulatory Visit: Payer: Self-pay | Admitting: Obstetrics & Gynecology

## 2022-08-31 DIAGNOSIS — Z1231 Encounter for screening mammogram for malignant neoplasm of breast: Secondary | ICD-10-CM

## 2022-09-29 DIAGNOSIS — Z23 Encounter for immunization: Secondary | ICD-10-CM | POA: Diagnosis not present

## 2022-10-15 ENCOUNTER — Ambulatory Visit: Payer: BC Managed Care – PPO

## 2022-11-09 ENCOUNTER — Ambulatory Visit
Admission: RE | Admit: 2022-11-09 | Discharge: 2022-11-09 | Disposition: A | Discharge status: Home / Self Care | Payer: BC Managed Care – PPO | Source: Ambulatory Visit | Attending: Obstetrics & Gynecology | Admitting: Obstetrics & Gynecology

## 2022-11-09 DIAGNOSIS — Z1231 Encounter for screening mammogram for malignant neoplasm of breast: Secondary | ICD-10-CM

## 2022-11-15 DIAGNOSIS — L570 Actinic keratosis: Secondary | ICD-10-CM | POA: Diagnosis not present

## 2022-11-15 DIAGNOSIS — D2261 Melanocytic nevi of right upper limb, including shoulder: Secondary | ICD-10-CM | POA: Diagnosis not present

## 2022-11-15 DIAGNOSIS — D2262 Melanocytic nevi of left upper limb, including shoulder: Secondary | ICD-10-CM | POA: Diagnosis not present

## 2022-11-15 DIAGNOSIS — D2272 Melanocytic nevi of left lower limb, including hip: Secondary | ICD-10-CM | POA: Diagnosis not present

## 2022-11-15 DIAGNOSIS — D2271 Melanocytic nevi of right lower limb, including hip: Secondary | ICD-10-CM | POA: Diagnosis not present

## 2022-11-16 ENCOUNTER — Other Ambulatory Visit (HOSPITAL_BASED_OUTPATIENT_CLINIC_OR_DEPARTMENT_OTHER): Payer: Self-pay | Admitting: Obstetrics & Gynecology

## 2022-11-16 ENCOUNTER — Other Ambulatory Visit (HOSPITAL_BASED_OUTPATIENT_CLINIC_OR_DEPARTMENT_OTHER): Payer: Self-pay | Admitting: *Deleted

## 2022-11-16 DIAGNOSIS — R6882 Decreased libido: Secondary | ICD-10-CM

## 2022-11-16 MED ORDER — NONFORMULARY OR COMPOUNDED ITEM: 1 refills | Status: DC

## 2023-01-14 DIAGNOSIS — E049 Nontoxic goiter, unspecified: Secondary | ICD-10-CM | POA: Diagnosis not present

## 2023-01-14 DIAGNOSIS — R7302 Impaired glucose tolerance (oral): Secondary | ICD-10-CM | POA: Diagnosis not present

## 2023-03-01 ENCOUNTER — Ambulatory Visit (INDEPENDENT_AMBULATORY_CARE_PROVIDER_SITE_OTHER): Payer: BC Managed Care – PPO | Admitting: Obstetrics & Gynecology

## 2023-03-01 ENCOUNTER — Encounter (HOSPITAL_BASED_OUTPATIENT_CLINIC_OR_DEPARTMENT_OTHER): Payer: Self-pay | Admitting: Obstetrics & Gynecology

## 2023-03-01 VITALS — BP 116/85 | HR 90 | Ht 64.75 in | Wt 147.6 lb

## 2023-03-01 DIAGNOSIS — E282 Polycystic ovarian syndrome: Secondary | ICD-10-CM

## 2023-03-01 DIAGNOSIS — Z01419 Encounter for gynecological examination (general) (routine) without abnormal findings: Secondary | ICD-10-CM

## 2023-03-01 DIAGNOSIS — D6851 Activated protein C resistance: Secondary | ICD-10-CM | POA: Diagnosis not present

## 2023-03-01 DIAGNOSIS — R7303 Prediabetes: Secondary | ICD-10-CM

## 2023-03-01 DIAGNOSIS — Z8632 Personal history of gestational diabetes: Secondary | ICD-10-CM

## 2023-03-01 NOTE — Progress Notes (Signed)
52 y.o. [id number] Married White or Caucasian female here for annual exam.  Doing well.  Cycles are still regular.  Flow is about 4 days.  Flow is normal and not heavy.    Has hba1c in February that was 6.1.    Mother was with them last year for some months.  She has dementia and Parkinson's.  The transition was hard and she is back in [place] with her sister now.    Using topical testosterone.  Does not need RF  Patient's last menstrual period was 02/18/2023 (exact date).          Sexually active: Yes.    The current method of family planning is vasectomy.    Smoker:  no  Health Maintenance: Pap:  02/17/21 neg History of abnormal Pap:  remote hx MMG:  11/09/22 neg Cologuard: 05/23/22 neg BMD:   guidelines reviewed Screening Labs: planned for the summer.  Saw Dr. Forde Dandy in February.    reports that she has never smoked. She has never used smokeless tobacco. She reports that she does not currently use alcohol. She reports that she does not use drugs.  Past Medical History:  Diagnosis Date   Clotting disorder [facility])    carrier for MTHFR & Factor 5 Liden    Gestational diabetes    2006 & 2009    Infertility, female    PCOS (polycystic ovarian syndrome)    Prediabetes     Past Surgical History:  Procedure Laterality Date   COLPOSCOPY  1998   IVF     maxillofacial surgery      teenager due to TMJ   TONSILLECTOMY      Current Outpatient Medications  Medication Sig Dispense Refill   cetirizine (ZYRTEC) 10 MG tablet Take 10 mg by mouth as needed for allergies.     ibuprofen (ADVIL,MOTRIN) 200 MG tablet Take 600 mg by mouth as needed.     NONFORMULARY OR COMPOUNDED ITEM Topical testosterone 2%.  1 click (123456) topically twice weekly.  #57ml bottle.  #1 RF. 1 each 1   rosuvastatin (CRESTOR) 5 MG tablet Take 5 mg by mouth daily.     No current facility-administered medications for this visit.    Family History  Problem Relation Age of Onset   Osteoporosis Mother    Hyperlipidemia  Mother    Parkinson's disease Mother    Diabetes Father    Hyperlipidemia Father    GER disease Father    Cancer Father        CLL (Chronic Lymphocytic Leukemia)   Osteoporosis Maternal Grandmother     ROS: Constitutional: negative Genitourinary:negative  Exam:   BP 116/85   Pulse 90   Ht 5' 4.75" (1.645 m)   Wt 147 lb 9.6 oz (67 kg)   LMP 02/18/2023 (Exact Date)   BMI 24.75 kg/m   Height: 5' 4.75" (164.5 cm)  General appearance: alert, cooperative and appears stated age Head: Normocephalic, without obvious abnormality, atraumatic Neck: no adenopathy, supple, symmetrical, trachea midline and thyroid normal to inspection and palpation Lungs: clear to auscultation bilaterally Breasts: normal appearance, no masses or tenderness Heart: regular rate and rhythm Abdomen: soft, non-tender; bowel sounds normal; no masses,  no organomegaly Extremities: extremities normal, atraumatic, no cyanosis or edema Skin: Skin color, texture, turgor normal. No rashes or lesions Lymph nodes: Cervical, supraclavicular, and axillary nodes normal. No abnormal inguinal nodes palpated Neurologic: Grossly normal   Pelvic: External genitalia:  no lesions  Urethra:  normal appearing urethra with no masses, tenderness or lesions              Bartholins and Skenes: normal                 Vagina: normal appearing vagina with normal color and no discharge, no lesions              Cervix: no lesions              Pap taken: No. Bimanual Exam:  Uterus:  normal size, contour, position, consistency, mobility, non-tender              Adnexa: normal adnexa               Rectovaginal: Confirms               Anus:  normal sphincter tone, no lesions  Chaperone, Ezekiel Ina, RN, was present for exam.  Assessment/Plan: 1. Well woman exam with routine gynecological exam - Pap smear neg 2022 - Mammogram 11/2022 - Colonoscopy declined but cologuard negative 2023 - lab work done with PCP, Dr. Forde Dandy -  vaccines reviewed/updated  2. Factor V Leiden carrier ([place])  3. PCOS (polycystic ovarian syndrome)  4. History of gestational diabetes  5. Pre-diabetes - followed by Dr. Forde Dandy  6.  Decreased libido - on topical testosterone.  No hx of DVT with pregnancies.  Level last year was 82.

## 2023-07-22 DIAGNOSIS — E049 Nontoxic goiter, unspecified: Secondary | ICD-10-CM | POA: Diagnosis not present

## 2023-07-22 DIAGNOSIS — R7302 Impaired glucose tolerance (oral): Secondary | ICD-10-CM | POA: Diagnosis not present

## 2023-07-23 DIAGNOSIS — Z1331 Encounter for screening for depression: Secondary | ICD-10-CM | POA: Diagnosis not present

## 2023-07-23 DIAGNOSIS — Z0000 Encounter for general adult medical examination without abnormal findings: Secondary | ICD-10-CM | POA: Diagnosis not present

## 2023-07-23 DIAGNOSIS — R82998 Other abnormal findings in urine: Secondary | ICD-10-CM | POA: Diagnosis not present

## 2023-07-23 DIAGNOSIS — R7302 Impaired glucose tolerance (oral): Secondary | ICD-10-CM | POA: Diagnosis not present

## 2023-07-23 DIAGNOSIS — Z1339 Encounter for screening examination for other mental health and behavioral disorders: Secondary | ICD-10-CM | POA: Diagnosis not present

## 2023-09-16 ENCOUNTER — Telehealth (HOSPITAL_BASED_OUTPATIENT_CLINIC_OR_DEPARTMENT_OTHER): Payer: Self-pay

## 2023-09-16 DIAGNOSIS — R6882 Decreased libido: Secondary | ICD-10-CM

## 2023-09-16 NOTE — Telephone Encounter (Signed)
Pt called and requested rx refill on her testosterone.

## 2023-09-18 MED ORDER — NONFORMULARY OR COMPOUNDED ITEM: 1 refills | Status: DC

## 2023-09-18 NOTE — Addendum Note (Signed)
Addended by: Jerene Bears on: 09/18/2023 10:54 AM   Modules accepted: Orders

## 2023-09-19 DIAGNOSIS — Z23 Encounter for immunization: Secondary | ICD-10-CM | POA: Diagnosis not present

## 2023-10-21 ENCOUNTER — Other Ambulatory Visit: Payer: Self-pay | Admitting: Obstetrics & Gynecology

## 2023-10-21 DIAGNOSIS — Z1231 Encounter for screening mammogram for malignant neoplasm of breast: Secondary | ICD-10-CM

## 2023-11-19 ENCOUNTER — Ambulatory Visit
Admission: RE | Admit: 2023-11-19 | Discharge: 2023-11-19 | Disposition: A | Discharge status: Home / Self Care | Payer: BC Managed Care – PPO | Source: Ambulatory Visit

## 2023-11-19 DIAGNOSIS — Z1231 Encounter for screening mammogram for malignant neoplasm of breast: Secondary | ICD-10-CM | POA: Diagnosis not present

## 2023-11-19 DIAGNOSIS — L819 Disorder of pigmentation, unspecified: Secondary | ICD-10-CM | POA: Diagnosis not present

## 2023-11-19 DIAGNOSIS — D224 Melanocytic nevi of scalp and neck: Secondary | ICD-10-CM | POA: Diagnosis not present

## 2023-11-19 DIAGNOSIS — D2262 Melanocytic nevi of left upper limb, including shoulder: Secondary | ICD-10-CM | POA: Diagnosis not present

## 2023-11-19 DIAGNOSIS — D2261 Melanocytic nevi of right upper limb, including shoulder: Secondary | ICD-10-CM | POA: Diagnosis not present

## 2024-01-14 DIAGNOSIS — E049 Nontoxic goiter, unspecified: Secondary | ICD-10-CM | POA: Diagnosis not present

## 2024-01-14 DIAGNOSIS — R7302 Impaired glucose tolerance (oral): Secondary | ICD-10-CM | POA: Diagnosis not present

## 2024-03-13 ENCOUNTER — Other Ambulatory Visit (HOSPITAL_COMMUNITY)
Admission: RE | Admit: 2024-03-13 | Discharge: 2024-03-13 | Disposition: A | Discharge status: Home / Self Care | Source: Ambulatory Visit | Attending: Obstetrics & Gynecology | Admitting: Obstetrics & Gynecology

## 2024-03-13 ENCOUNTER — Encounter (HOSPITAL_BASED_OUTPATIENT_CLINIC_OR_DEPARTMENT_OTHER): Payer: Self-pay | Admitting: Obstetrics & Gynecology

## 2024-03-13 ENCOUNTER — Ambulatory Visit (HOSPITAL_BASED_OUTPATIENT_CLINIC_OR_DEPARTMENT_OTHER): Payer: BC Managed Care – PPO | Admitting: Obstetrics & Gynecology

## 2024-03-13 VITALS — BP 118/76 | HR 82 | Ht 63.5 in | Wt 150.0 lb

## 2024-03-13 DIAGNOSIS — Z113 Encounter for screening for infections with a predominantly sexual mode of transmission: Secondary | ICD-10-CM

## 2024-03-13 DIAGNOSIS — Z01419 Encounter for gynecological examination (general) (routine) without abnormal findings: Secondary | ICD-10-CM | POA: Diagnosis not present

## 2024-03-13 DIAGNOSIS — Z124 Encounter for screening for malignant neoplasm of cervix: Secondary | ICD-10-CM | POA: Insufficient documentation

## 2024-03-13 DIAGNOSIS — Z1151 Encounter for screening for human papillomavirus (HPV): Secondary | ICD-10-CM

## 2024-03-13 DIAGNOSIS — R6882 Decreased libido: Secondary | ICD-10-CM | POA: Diagnosis not present

## 2024-03-13 DIAGNOSIS — D6851 Activated protein C resistance: Secondary | ICD-10-CM

## 2024-03-13 DIAGNOSIS — E7849 Other hyperlipidemia: Secondary | ICD-10-CM

## 2024-03-13 MED ORDER — NONFORMULARY OR COMPOUNDED ITEM: 1 refills | Status: AC

## 2024-03-13 NOTE — Progress Notes (Signed)
 ANNUAL EXAM Patient name: Tina Jensen MRN [id number]  Date of birth: 02/07/1971 Chief Complaint:   AEX  History of Present Illness:   Tina Jensen is a 53 y.o. [phone] Caucasian female being seen today for a routine annual exam.  Doing well.  Cycles have become more irregular this past year.  Flow is lasting 3-4 days.  Heaviest on the first day.    Patient's last menstrual period was 03/13/2024.   Upstream - 03/13/24 1008       Pregnancy Intention Screening   Does the patient want to become pregnant in the next year? No    Does the patient's partner want to become pregnant in the next year? No    Would the patient like to discuss contraceptive options today? No      Contraception Wrap Up   Current Method Vasectomy             Last pap 02/17/21. Results were: NILM w/ HRHPV negative. H/O abnormal [id number] hx Last mammogram: 11/19/23. Results were: normal. Family h/o breast cancer: no Last cologuard: 05/23/22. Results were: normal. Family h/o colorectal cancer: no     03/13/2024   10:08 AM 03/01/2023   10:49 AM 02/26/2022   10:47 AM  Depression screen PHQ 2/9  Decreased Interest 0 0 0  Down, Depressed, Hopeless 0 0 0  PHQ - 2 Score 0 0 0    Review of Systems:   Pertinent items are noted in HPI  Denies any abnormal vaginal bleeding or vaginal discharge, urinary issues,  Pertinent History Reviewed:  Reviewed past medical,surgical, social and family history.  Reviewed problem list, medications and allergies. Physical Assessment:   Vitals:   03/13/24 1005  BP: 118/76  Pulse: 82  Weight: 150 lb (68 kg)  Height: 5' 3.5" (1.613 m)  Body mass index is 26.15 kg/m.        Physical Examination:   General appearance - well appearing, and in no distress  Mental status - alert, oriented to person, place, and time  Psych:  She has a normal mood and affect  Skin - warm and dry, normal color, no suspicious lesions noted  Chest - effort normal, all lung fields clear to  auscultation bilaterally  Heart - normal rate and regular rhythm  Neck:  midline trachea, symmetric thyroid enlargement, no nodules, non tender  Breasts - breasts appear normal, no suspicious masses, no skin or nipple changes or  axillary nodes  Abdomen - soft, nontender, nondistended, no masses or organomegaly  Pelvic - VULVA: normal appearing vulva with no masses, tenderness or lesions  VAGINA: normal appearing vagina with normal color and discharge, no lesions  CERVIX: normal appearing cervix without discharge or lesions, no CMT  Thin prep pap is done with HR HPV cotesting  UTERUS: uterus is felt to be normal size, shape, consistency and nontender   ADNEXA: No adnexal masses or tenderness noted.  Rectal - normal rectal, good sphincter tone, no masses felt.  Extremities:  No swelling or varicosities noted  Chaperone present for exam  No results found for this or any previous visit (from the past 24 hours).  Assessment & Plan:  1. Well woman exam with routine gynecological exam ([facility]) - Pap smear and HR HPV obtained today - Mammogram 11/2023 - Colonoscopy declined.  Cologuard 2023 negative.  Will repeat next year. - Bone mineral density guidelines reviewed - lab work done with PCP, Dr. Evlyn Kanner - vaccines reviewed/updated  2. Decreased libido - Hepatitis C antibody -  HIV Antibody (routine testing w rflx) - Cytology - PAP( [facility]) - NONFORMULARY OR COMPOUNDED ITEM; Topical testosterone 2%.  1 click (1.9ml) topically twice weekly.  #7ml bottle.  #1 RF.  Dispense: 1 each; Refill: 1 - Testosterone, Total, LC/MS/MS  3. Factor V Leiden carrier ([facility])  4. Other hyperlipidemia - on Crestor   Orders Placed This Encounter  Procedures   Hepatitis C antibody   HIV Antibody (routine testing w rflx)   Testosterone, Total, LC/MS/MS    Meds:  Meds ordered this encounter  Medications   NONFORMULARY OR COMPOUNDED ITEM    Sig: Topical testosterone 2%.  1 click (1.23ml) topically  twice weekly.  #44ml bottle.  #1 RF.    Dispense:  1 each    Refill:  1    Follow-up: Return in about 1 year (around 03/13/2025).  Jerene Bears, MD 03/13/2024 10:54 AM

## 2024-03-14 ENCOUNTER — Encounter (HOSPITAL_BASED_OUTPATIENT_CLINIC_OR_DEPARTMENT_OTHER): Payer: Self-pay | Admitting: Obstetrics & Gynecology

## 2024-03-14 LAB — HEPATITIS C ANTIBODY: Hep C Virus Ab: NONREACTIVE

## 2024-03-14 LAB — HIV ANTIBODY (ROUTINE TESTING W REFLEX): HIV Screen 4th Generation wRfx: NONREACTIVE

## 2024-03-16 ENCOUNTER — Encounter (HOSPITAL_BASED_OUTPATIENT_CLINIC_OR_DEPARTMENT_OTHER): Payer: Self-pay | Admitting: Obstetrics & Gynecology

## 2024-03-16 LAB — CYTOLOGY - PAP
Comment: NEGATIVE
Diagnosis: NEGATIVE
High risk HPV: NEGATIVE

## 2024-03-16 LAB — TESTOSTERONE, TOTAL, LC/MS/MS: Testosterone, total: 19.4 ng/dL

## 2024-07-22 DIAGNOSIS — E785 Hyperlipidemia, unspecified: Secondary | ICD-10-CM | POA: Diagnosis not present

## 2024-07-22 DIAGNOSIS — R7302 Impaired glucose tolerance (oral): Secondary | ICD-10-CM | POA: Diagnosis not present

## 2024-07-22 DIAGNOSIS — E049 Nontoxic goiter, unspecified: Secondary | ICD-10-CM | POA: Diagnosis not present

## 2024-07-29 DIAGNOSIS — Z1339 Encounter for screening examination for other mental health and behavioral disorders: Secondary | ICD-10-CM | POA: Diagnosis not present

## 2024-07-29 DIAGNOSIS — R7302 Impaired glucose tolerance (oral): Secondary | ICD-10-CM | POA: Diagnosis not present

## 2024-07-29 DIAGNOSIS — R82998 Other abnormal findings in urine: Secondary | ICD-10-CM | POA: Diagnosis not present

## 2024-07-29 DIAGNOSIS — Z0000 Encounter for general adult medical examination without abnormal findings: Secondary | ICD-10-CM | POA: Diagnosis not present

## 2024-07-29 DIAGNOSIS — Z1331 Encounter for screening for depression: Secondary | ICD-10-CM | POA: Diagnosis not present

## 2024-12-22 ENCOUNTER — Other Ambulatory Visit: Payer: Self-pay | Admitting: Endocrinology

## 2024-12-22 DIAGNOSIS — Z1231 Encounter for screening mammogram for malignant neoplasm of breast: Secondary | ICD-10-CM

## 2025-01-01 ENCOUNTER — Ambulatory Visit: Admission: RE | Admit: 2025-01-01 | Discharge: 2025-01-01 | Disposition: A | Source: Ambulatory Visit

## 2025-01-01 DIAGNOSIS — Z1231 Encounter for screening mammogram for malignant neoplasm of breast: Secondary | ICD-10-CM

## 2025-03-15 ENCOUNTER — Ambulatory Visit (HOSPITAL_BASED_OUTPATIENT_CLINIC_OR_DEPARTMENT_OTHER): Admitting: Obstetrics & Gynecology
